# Patient Record
Sex: Female | Born: 1980 | Race: White | Hispanic: No | Marital: Married | Smoking: Current every day smoker
Health system: Southern US, Community
[De-identification: ages and names within clinical notes are randomized; demographics above are authoritative.]

## PROBLEM LIST (undated history)

## (undated) DIAGNOSIS — E119 Type 2 diabetes mellitus without complications: Secondary | ICD-10-CM

## (undated) DIAGNOSIS — K859 Acute pancreatitis without necrosis or infection, unspecified: Secondary | ICD-10-CM

## (undated) HISTORY — PX: BILE DUCT STENT PLACEMENT: SHX1227

## (undated) HISTORY — PX: OTHER SURGICAL HISTORY: SHX169

## (undated) HISTORY — PX: KNEE SURGERY: SHX244

## (undated) HISTORY — PX: CHOLECYSTECTOMY: SHX55

## (undated) HISTORY — PX: SYMPATHECTOMY: SHX792

---

## 2015-06-22 ENCOUNTER — Emergency Department: Admit: 2015-06-22 | Payer: Self-pay | Primary: Student in an Organized Health Care Education/Training Program

## 2015-06-22 ENCOUNTER — Inpatient Hospital Stay
Admit: 2015-06-22 | Discharge: 2015-06-23 | Disposition: A | Discharge status: Home / Self Care | Payer: Self-pay | Attending: Emergency Medicine

## 2015-06-22 DIAGNOSIS — F10120 Alcohol abuse with intoxication, uncomplicated: Secondary | ICD-10-CM

## 2015-06-22 MED ORDER — SODIUM CHLORIDE 0.9 % IJ SYRG
INTRAMUSCULAR | Status: DC | PRN
Start: 2015-06-22 — End: 2015-06-23

## 2015-06-22 MED ORDER — SODIUM CHLORIDE 0.9 % IJ SYRG
Freq: Three times a day (TID) | INTRAMUSCULAR | Status: DC
Start: 2015-06-22 — End: 2015-06-23

## 2015-06-22 NOTE — ED Notes (Signed)
Pt refusing all vital signs, IV and to put gown on. Dr. Leonel Ramsay notified.
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
## 2015-06-22 NOTE — ED Notes (Signed)
Pt has no complaints at this time. She is resting in room.

## 2015-06-22 NOTE — ED Notes (Signed)
Pt to CT scan.
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
## 2015-06-23 LAB — METABOLIC PANEL, COMPREHENSIVE
A-G Ratio: 0.6 — ABNORMAL LOW (ref 1.2–3.5)
ALT (SGPT): 48 U/L (ref 12–65)
AST (SGOT): 367 U/L — ABNORMAL HIGH (ref 15–37)
Albumin: 3 g/dL — ABNORMAL LOW (ref 3.5–5.0)
Alk. phosphatase: 452 U/L — ABNORMAL HIGH (ref 50–136)
Anion gap: 11 mmol/L (ref 7–16)
BUN: 3 MG/DL — ABNORMAL LOW (ref 6–23)
Bilirubin, total: 1 MG/DL (ref 0.2–1.1)
CO2: 32 mmol/L (ref 21–32)
Calcium: 8 MG/DL — ABNORMAL LOW (ref 8.3–10.4)
Chloride: 102 mmol/L (ref 98–107)
Creatinine: 0.55 MG/DL — ABNORMAL LOW (ref 0.6–1.0)
GFR est AA: >60 mL/min/{1.73_m2} (ref >60)
GFR est non-AA: >60 mL/min/{1.73_m2} (ref >60)
Globulin: 5.3 g/dL — ABNORMAL HIGH (ref 2.3–3.5)
Glucose: 106 mg/dL — ABNORMAL HIGH (ref 65–100)
Potassium: 2.7 mmol/L — CL (ref 3.5–5.1)
Protein, total: 8.3 g/dL — ABNORMAL HIGH (ref 6.3–8.2)
Sodium: 145 mmol/L (ref 136–145)

## 2015-06-23 LAB — CBC W/O DIFF
HCT: 35.9 % (ref 35.8–46.3)
HGB: 11.3 g/dL — ABNORMAL LOW (ref 11.7–15.4)
MCH: 28.6 PG (ref 26.1–32.9)
MCHC: 31.5 g/dL (ref 31.4–35.0)
MCV: 90.9 FL (ref 79.6–97.8)
MPV: 10.3 FL — ABNORMAL LOW (ref 10.8–14.1)
PLATELET: 270 10*3/uL (ref 150–450)
RBC: 3.95 M/uL — ABNORMAL LOW (ref 4.05–5.25)
RDW: 17.3 % — ABNORMAL HIGH (ref 11.9–14.6)
WBC: 7.6 10*3/uL (ref 4.3–11.1)

## 2015-06-23 LAB — ETHYL ALCOHOL: ALCOHOL(ETHYL),SERUM: 433 MG/DL — CR

## 2015-06-23 LAB — MAGNESIUM: Magnesium: 1.9 mg/dL (ref 1.8–2.4)

## 2015-06-23 MED ORDER — POTASSIUM CHLORIDE SR 20 MEQ TAB, PARTICLES/CRYSTALS
20 mEq | ORAL | Status: AC
Start: 2015-06-23 — End: 2015-06-22
  Administered 2015-06-23: 02:00:00 via ORAL

## 2015-06-23 MED FILL — KLOR-CON M20 MEQ TABLET,EXTENDED RELEASE: 20 mEq | ORAL | Qty: 2

## 2015-06-23 NOTE — ED Notes (Signed)
Pt respositioned self in bed, no complaints voiced, settled back to sleep.

## 2015-06-23 NOTE — ED Notes (Signed)
Pt given ginger ale, number for [facility],walked to lobby.

## 2015-06-23 NOTE — ED Notes (Signed)
Pt sitting in the sink having a bowel movement, stool all over the floor.Asked pt why she didn't go to the bathroom.Pt stated "she didn't want to go to the bathroom"

## 2015-06-23 NOTE — ED Notes (Signed)
Pt awaken easily, call bell connected to siderail showed pt, she states no complaints , settled back to sleep.

## 2015-06-23 NOTE — ED Notes (Signed)
I have reviewed discharge instructions with the patient.  The patient verbalized understanding.Pt assisted to lobby with additional intructions.

## 2015-06-23 NOTE — ED Notes (Signed)
Pt given scrubs, wipes and instructed to clean up.

## 2015-06-23 NOTE — ED Notes (Signed)
Pt being allowed to sleep a while longer. She states that she does not want to be connected to Vital sign monitors.

## 2017-11-19 ENCOUNTER — Encounter (HOSPITAL_COMMUNITY): Payer: Self-pay

## 2017-11-19 ENCOUNTER — Emergency Department (HOSPITAL_COMMUNITY)
Admission: EM | Admit: 2017-11-19 | Discharge: 2017-11-20 | Disposition: A | Discharge status: Home / Self Care | Payer: BLUE CROSS/BLUE SHIELD | Attending: Emergency Medicine | Admitting: Emergency Medicine

## 2017-11-19 DIAGNOSIS — E119 Type 2 diabetes mellitus without complications: Secondary | ICD-10-CM | POA: Insufficient documentation

## 2017-11-19 DIAGNOSIS — G8929 Other chronic pain: Secondary | ICD-10-CM

## 2017-11-19 DIAGNOSIS — F17200 Nicotine dependence, unspecified, uncomplicated: Secondary | ICD-10-CM | POA: Diagnosis not present

## 2017-11-19 DIAGNOSIS — R109 Unspecified abdominal pain: Secondary | ICD-10-CM | POA: Insufficient documentation

## 2017-11-19 DIAGNOSIS — Z794 Long term (current) use of insulin: Secondary | ICD-10-CM | POA: Insufficient documentation

## 2017-11-19 DIAGNOSIS — Z79899 Other long term (current) drug therapy: Secondary | ICD-10-CM | POA: Diagnosis not present

## 2017-11-19 HISTORY — DX: Type 2 diabetes mellitus without complications: E11.9

## 2017-11-19 HISTORY — DX: Acute pancreatitis without necrosis or infection, unspecified: K85.90

## 2017-11-19 LAB — CBC
HCT: 41.8 % (ref 36.0–46.0)
Hemoglobin: 13.4 g/dL (ref 12.0–15.0)
MCH: 28.8 pg (ref 26.0–34.0)
MCHC: 32.1 g/dL (ref 30.0–36.0)
MCV: 89.9 fL (ref 78.0–100.0)
Platelets: 190 10*3/uL (ref 150–400)
RBC: 4.65 MIL/uL (ref 3.87–5.11)
RDW: 14.9 % (ref 11.5–15.5)
WBC: 8.7 10*3/uL (ref 4.0–10.5)

## 2017-11-19 LAB — URINALYSIS, ROUTINE W REFLEX MICROSCOPIC
Bilirubin Urine: NEGATIVE
HGB URINE DIPSTICK: NEGATIVE
Ketones, ur: NEGATIVE mg/dL
Leukocytes, UA: NEGATIVE
Nitrite: NEGATIVE
PROTEIN: NEGATIVE mg/dL
SPECIFIC GRAVITY, URINE: 1.032 — AB (ref 1.005–1.030)
pH: 8 (ref 5.0–8.0)

## 2017-11-19 LAB — COMPREHENSIVE METABOLIC PANEL
ALT: 20 U/L (ref 0–44)
AST: 25 U/L (ref 15–41)
Albumin: 3.4 g/dL — ABNORMAL LOW (ref 3.5–5.0)
Alkaline Phosphatase: 150 U/L — ABNORMAL HIGH (ref 38–126)
Anion gap: 13 (ref 5–15)
CALCIUM: 8.9 mg/dL (ref 8.9–10.3)
CHLORIDE: 94 mmol/L — AB (ref 98–111)
CO2: 25 mmol/L (ref 22–32)
CREATININE: 0.73 mg/dL (ref 0.44–1.00)
GFR calc Af Amer: >60 mL/min (ref >60)
GFR calc non Af Amer: >60 mL/min (ref >60)
GLUCOSE: 699 mg/dL — AB (ref 70–99)
Potassium: 4.2 mmol/L (ref 3.5–5.1)
Sodium: 132 mmol/L — ABNORMAL LOW (ref 135–145)
Total Bilirubin: 0.6 mg/dL (ref 0.3–1.2)
Total Protein: 6.7 g/dL (ref 6.5–8.1)

## 2017-11-19 LAB — I-STAT BETA HCG BLOOD, ED (MC, WL, AP ONLY): I-stat hCG, quantitative: <5 m[IU]/mL (ref <5)

## 2017-11-19 LAB — LIPASE, BLOOD: Lipase: 178 U/L — ABNORMAL HIGH (ref 11–51)

## 2017-11-19 NOTE — ED Notes (Signed)
Lab called with glucose result of 699

## 2017-11-19 NOTE — ED Triage Notes (Signed)
Pt states that she is here for pancreatitis pain, started last night, denies alcohol use, vomited x 1.

## 2017-11-20 MED ORDER — HYDROMORPHONE HCL 1 MG/ML IJ SOLN
1.0000 mg | Freq: Once | INTRAMUSCULAR | Status: AC
  Administered 2017-11-20: 1 mg via INTRAVENOUS
  Filled 2017-11-20: qty 1

## 2017-11-20 MED ORDER — FAMOTIDINE IN NACL 20-0.9 MG/50ML-% IV SOLN
20.0000 mg | INTRAVENOUS | Status: AC
  Administered 2017-11-20: 20 mg via INTRAVENOUS
  Filled 2017-11-20: qty 50

## 2017-11-20 MED ORDER — INSULIN ASPART 100 UNIT/ML ~~LOC~~ SOLN
10.0000 [IU] | Freq: Once | SUBCUTANEOUS | Status: AC
  Administered 2017-11-20: 10 [IU] via INTRAVENOUS
  Filled 2017-11-20: qty 1

## 2017-11-20 MED ORDER — KETOROLAC TROMETHAMINE 30 MG/ML IJ SOLN
30.0000 mg | Freq: Once | INTRAMUSCULAR | Status: AC
  Administered 2017-11-20: 30 mg via INTRAVENOUS
  Filled 2017-11-20: qty 1

## 2017-11-20 MED ORDER — SODIUM CHLORIDE 0.9 % IV BOLUS
1000.0000 mL | Freq: Once | INTRAVENOUS | Status: AC
  Administered 2017-11-20: 1000 mL via INTRAVENOUS

## 2017-11-20 NOTE — ED Provider Notes (Signed)
[facility] EMERGENCY DEPARTMENT Provider Note   CSN: [id number] Arrival date & time: 11/19/17  2130     History   Chief Complaint Chief Complaint  Patient presents with  . Abdominal Pain    HPI Heather Hunt is a 37 y.o. female.  37 year old female with PMH of recurrent acute pancreatitis (?idiopathic vs. 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 Diff on vanc presents to the ED for c/o epigastric pain. Pain is sharp, constant, c/w prior episodes of pancreatitis. Has had nausea and vomiting x 1. Patient visiting from out of town. Has use her home Fentanyl patches and oxycodone tablets for pain without relief. No fevers, bowel changes, urinary symptoms. Abdominal surgical hx significant for cholecystectomy.     Past Medical History:  Diagnosis Date  . Diabetes mellitus without complication (HCC)   . Pancreatitis     There are no active problems to display for this patient.   Past Surgical History:  Procedure Laterality Date  . BILE DUCT STENT PLACEMENT    . KNEE SURGERY    . pancreas stent       OB History   None      Home Medications    Prior to Admission medications   Medication Sig Start Date End Date Taking? Authorizing Provider  fentaNYL (DURAGESIC - DOSED MCG/HR) 12 MCG/HR Place 12.5 mcg onto the skin every 3 (three) days.    Yes [provider]  fentaNYL (DURAGESIC - DOSED MCG/HR) 25 MCG/HR patch Place 25 mcg onto the skin every 3 (three) days.    Yes [provider]  insulin degludec (TRESIBA FLEXTOUCH) 100 UNIT/ML SOPN FlexTouch Pen Inject 26 Units into the skin daily. 08/03/17  Yes [provider]  insulin regular (HUMULIN R) 100 units/mL injection Inject 1-10 Units into the skin 3 (three) times daily with meals. Sliding scale 03/04/17   Yes [provider]  lipase/protease/amylase (CREON) 12000 units CPEP capsule Take 2 capsules by mouth 3 (three) times daily.   Yes [provider]  ondansetron (ZOFRAN-ODT) 4 MG disintegrating tablet Take 4 mg by mouth every 6 (six) hours as needed for nausea or vomiting.  11/11/17  Yes [provider]  Oxycodone HCl 10 MG TABS Take 10 mg by mouth every 4 (four) hours as needed (for pain).  08/03/17  Yes [provider]  pantoprazole (PROTONIX) 40 MG tablet Take 40 mg by mouth daily. 10/12/17  Yes [provider]  promethazine (PHENERGAN) 12.5 MG tablet Take 12.5 mg by mouth every 6 (six) hours as needed for nausea or vomiting.  11/11/17  Yes [provider]    Family History No family history on file.  Social History Social History   Tobacco Use  . Smoking status: Current Every Day Smoker  . Smokeless tobacco: Never Used  Substance Use Topics  . Alcohol use: Not Currently  . Drug use: Never     Allergies   Morphine and related   Review of Systems Review of Systems Ten systems reviewed and are negative for acute change, except as noted in the HPI.    Physical Exam Updated Vital Signs BP 90/60   Pulse 77   Temp 98.3 F (36.8 C) (Oral)   Resp 14   SpO2 97%   Physical Exam  Constitutional: She is oriented to person, place, and time.  She appears well-developed and well-nourished. No distress.  Nontoxic appearing and in NAD  HENT:  Head: Normocephalic and atraumatic.  Eyes: Conjunctivae and EOM are normal. No scleral icterus.  Neck: Normal range of motion.  Cardiovascular: Normal rate, regular rhythm and intact distal pulses.  Pulmonary/Chest: Effort normal. No stridor. No respiratory distress. She has no wheezes. She has no rales.  Lungs CTAB. Respirations even and unlabored.  Abdominal: Soft. She exhibits no mass. There is tenderness. There is no guarding.  Epigastric TTP. Abdomen soft, nondistended.  Musculoskeletal:  Normal range of motion.  Neurological: She is alert and oriented to person, place, and time. She exhibits normal muscle tone. Coordination normal.  Skin: Skin is warm and dry. No rash noted. She is not diaphoretic. No erythema. No pallor.  Psychiatric: She has a normal mood and affect. Her behavior is normal.  Nursing note and vitals reviewed.    ED Treatments / Results  Labs (all labs ordered are listed, but only abnormal results are displayed) Labs Reviewed  LIPASE, BLOOD - Abnormal; Notable for the following components:      Result Value   Lipase 178 (*)    All other components within normal limits  COMPREHENSIVE METABOLIC PANEL - Abnormal; Notable for the following components:   Sodium 132 (*)    Chloride 94 (*)    Glucose, Bld 699 (*)    BUN <5 (*)    Albumin 3.4 (*)    Alkaline Phosphatase 150 (*)    All other components within normal limits  URINALYSIS, ROUTINE W REFLEX MICROSCOPIC - Abnormal; Notable for the following components:   Color, Urine STRAW (*)    Specific Gravity, Urine 1.032 (*)    Glucose, UA >=500 (*)    Bacteria, UA RARE (*)    All other components within normal limits  CBC  I-STAT BETA HCG BLOOD, ED ([facility], [facility], [facility] ONLY)    EKG None  Radiology No results found.  Procedures Procedures (including critical care time)  Medications Ordered in ED Medications  sodium chloride 0.9 % bolus 1,000 mL (0 mLs Intravenous Stopped 11/20/17 0249)  insulin aspart (novoLOG) injection 10 Units (10 Units Intravenous Given 11/20/17 0246)  famotidine (PEPCID) IVPB 20 mg premix (0 mg Intravenous Stopped 11/20/17 0321)  ketorolac (TORADOL) 30 MG/ML injection 30 mg (30 mg Intravenous Given 11/20/17 0246)  HYDROmorphone (DILAUDID) injection 1 mg (1 mg Intravenous Given 11/20/17 0246)  HYDROmorphone (DILAUDID) injection 1 mg (1 mg Intravenous Given 11/20/17 0429)     Initial Impression / Assessment and Plan / ED Course  I have reviewed the triage vital signs and the nursing  notes.  Pertinent labs & imaging results that were available during my care of the patient were reviewed by me and considered in my medical decision making (see chart for details).  Clinical Course as of Nov 24 642  Sat Nov 20, 2017  0205 Lipase(!): 178 [KH]    Clinical Course User Index [KH] Antony Madura, [place]    37 year old female presents to the emergency department for evaluation of abdominal pain.  She has known history of chronic pancreatitis and states that her pain today feels similar to prior exacerbations.  She has not taken her prescribed insulin accounting for her hyperglycemia today.  She has a normal anion gap and no ketonuria.  No concern for DKA at this time.  She was given 10 units of NovoLog for management of hyperglycemia.  Patient also administered 1 L IV fluids.  Lipase elevated at  178.  With prior admissions for acute pancreatitis, lipase has been at least 300.  She does have focal epigastric tenderness.  No peritoneal signs on exam.  Pain has been managed with Toradol, Pepcid, 1mg  IV Dilaudid x2.  The patient has had improvement in her abdominal pain with these medications.  Her repeat exam is stable.  She is now tolerating oral fluids.  Patient states that she feels as though her pain has improved.  Have stressed the need for compliance with outpatient medications.  Return precautions discussed and provided. Patient discharged in stable condition with no unaddressed concerns.   Final Clinical Impressions(s) / ED Diagnoses   Final diagnoses:  Chronic abdominal pain    ED Discharge Orders    None       Antony MaduraHumes, Miche Loughridge, PA-C 11/23/17 [id number]    Dione BoozeGlick, David, MD 11/25/17 1416

## 2017-12-07 ENCOUNTER — Emergency Department (HOSPITAL_COMMUNITY)
Admission: EM | Admit: 2017-12-07 | Discharge: 2017-12-08 | Disposition: A | Discharge status: Home / Self Care | Payer: BLUE CROSS/BLUE SHIELD | Attending: Emergency Medicine | Admitting: Emergency Medicine

## 2017-12-07 ENCOUNTER — Other Ambulatory Visit: Payer: Self-pay

## 2017-12-07 ENCOUNTER — Encounter (HOSPITAL_COMMUNITY): Payer: Self-pay

## 2017-12-07 DIAGNOSIS — Z794 Long term (current) use of insulin: Secondary | ICD-10-CM | POA: Insufficient documentation

## 2017-12-07 DIAGNOSIS — K861 Other chronic pancreatitis: Secondary | ICD-10-CM | POA: Diagnosis not present

## 2017-12-07 DIAGNOSIS — E1165 Type 2 diabetes mellitus with hyperglycemia: Secondary | ICD-10-CM | POA: Diagnosis not present

## 2017-12-07 DIAGNOSIS — Z79899 Other long term (current) drug therapy: Secondary | ICD-10-CM | POA: Diagnosis not present

## 2017-12-07 DIAGNOSIS — F17210 Nicotine dependence, cigarettes, uncomplicated: Secondary | ICD-10-CM | POA: Insufficient documentation

## 2017-12-07 DIAGNOSIS — R1013 Epigastric pain: Secondary | ICD-10-CM | POA: Diagnosis present

## 2017-12-07 DIAGNOSIS — R110 Nausea: Secondary | ICD-10-CM | POA: Diagnosis not present

## 2017-12-07 LAB — URINALYSIS, ROUTINE W REFLEX MICROSCOPIC
BILIRUBIN URINE: NEGATIVE
Bacteria, UA: NONE SEEN
HGB URINE DIPSTICK: NEGATIVE
Ketones, ur: NEGATIVE mg/dL
Leukocytes, UA: NEGATIVE
NITRITE: NEGATIVE
Protein, ur: NEGATIVE mg/dL
SPECIFIC GRAVITY, URINE: 1.027 (ref 1.005–1.030)
pH: 7 (ref 5.0–8.0)

## 2017-12-07 LAB — CBC
HCT: 40.3 % (ref 36.0–46.0)
HEMOGLOBIN: 13.5 g/dL (ref 12.0–15.0)
MCH: 29.3 pg (ref 26.0–34.0)
MCHC: 33.5 g/dL (ref 30.0–36.0)
MCV: 87.6 fL (ref 78.0–100.0)
PLATELETS: 165 10*3/uL (ref 150–400)
RBC: 4.6 MIL/uL (ref 3.87–5.11)
RDW: 15.8 % — ABNORMAL HIGH (ref 11.5–15.5)
WBC: 6.9 10*3/uL (ref 4.0–10.5)

## 2017-12-07 LAB — COMPREHENSIVE METABOLIC PANEL
ALK PHOS: 350 U/L — AB (ref 38–126)
ALT: 67 U/L — AB (ref 0–44)
ANION GAP: 10 (ref 5–15)
AST: 67 U/L — ABNORMAL HIGH (ref 15–41)
Albumin: 3.7 g/dL (ref 3.5–5.0)
BILIRUBIN TOTAL: 0.3 mg/dL (ref 0.3–1.2)
BUN: 6 mg/dL (ref 6–20)
CALCIUM: 9.2 mg/dL (ref 8.9–10.3)
CO2: 26 mmol/L (ref 22–32)
CREATININE: 0.48 mg/dL (ref 0.44–1.00)
Chloride: 99 mmol/L (ref 98–111)
GFR calc non Af Amer: >60 mL/min (ref >60)
GLUCOSE: 454 mg/dL — AB (ref 70–99)
Potassium: 3.9 mmol/L (ref 3.5–5.1)
Sodium: 135 mmol/L (ref 135–145)
TOTAL PROTEIN: 7.1 g/dL (ref 6.5–8.1)

## 2017-12-07 LAB — LIPASE, BLOOD: Lipase: 174 U/L — ABNORMAL HIGH (ref 11–51)

## 2017-12-07 LAB — I-STAT BETA HCG BLOOD, ED (MC, WL, AP ONLY)

## 2017-12-07 LAB — CBG MONITORING, ED: Glucose-Capillary: 202 mg/dL — ABNORMAL HIGH (ref 70–99)

## 2017-12-07 MED ORDER — GI COCKTAIL ~~LOC~~
30.0000 mL | Freq: Once | ORAL | Status: AC
  Administered 2017-12-07: 30 mL via ORAL
  Filled 2017-12-07: qty 30

## 2017-12-07 MED ORDER — SODIUM CHLORIDE 0.9 % IV BOLUS
1000.0000 mL | Freq: Once | INTRAVENOUS | Status: AC
  Administered 2017-12-07: 1000 mL via INTRAVENOUS

## 2017-12-07 MED ORDER — BLOOD GLUCOSE MONITOR KIT: PACK | 0 refills | Status: AC

## 2017-12-07 MED ORDER — HYDROMORPHONE HCL 1 MG/ML IJ SOLN
1.0000 mg | Freq: Once | INTRAMUSCULAR | Status: AC
  Administered 2017-12-07: 1 mg via INTRAVENOUS
  Filled 2017-12-07: qty 1

## 2017-12-07 MED ORDER — FAMOTIDINE IN NACL 20-0.9 MG/50ML-% IV SOLN
20.0000 mg | Freq: Once | INTRAVENOUS | Status: AC
  Administered 2017-12-07: 20 mg via INTRAVENOUS
  Filled 2017-12-07: qty 50

## 2017-12-07 MED ORDER — ONDANSETRON HCL 4 MG/2ML IJ SOLN
4.0000 mg | Freq: Once | INTRAMUSCULAR | Status: AC
  Administered 2017-12-07: 4 mg via INTRAVENOUS
  Filled 2017-12-07: qty 2

## 2017-12-07 MED ORDER — ONDANSETRON 4 MG PO TBDP: 4.0000 mg | ORAL_TABLET | Freq: Once | ORAL | Status: DC | PRN

## 2017-12-07 NOTE — Discharge Instructions (Addendum)
Please read and follow all provided instructions.  Your diagnoses today include:  1. Chronic pancreatitis, unspecified pancreatitis type (HCC)   2. Epigastric pain   3. Nausea   4. Type 2 diabetes mellitus with hyperglycemia, with long-term current use of insulin (HCC)    Work up here showed high blood glucose. Lipase is around your baseline. Please follow up with GI and with primary care.   Tests performed today include: Blood counts and electrolytes Blood tests to check liver and kidney function Blood tests to check pancreas function Urine test to look for infection and pregnancy (in women) Vital signs. See below for your results today.   Medications prescribed:   Take any prescribed medications only as directed.  Home care instructions:  Follow any educational materials contained in this packet.  Follow-up instructions: Please follow-up with your primary care provider in the next 2 days for further evaluation of your symptoms.    Return instructions:  SEEK IMMEDIATE MEDICAL ATTENTION IF: The pain does not go away or becomes severe  A temperature above 101F develops  Repeated vomiting occurs (multiple episodes)  The pain becomes localized to portions of the abdomen. The right side could possibly be appendicitis. In an adult, the left lower portion of the abdomen could be colitis or diverticulitis.  Blood is being passed in stools or vomit (bright red or black tarry stools)  You develop chest pain, difficulty breathing, dizziness or fainting, or become confused, poorly responsive, or inconsolable (young children) If you have any other emergent concerns regarding your health  Additional Information: Abdominal (belly) pain can be caused by many things. Your caregiver performed an examination and possibly ordered blood/urine tests and imaging (CT scan, x-rays, ultrasound). Many cases can be observed and treated at home after initial evaluation in the emergency department. Even  though you are being discharged home, abdominal pain can be unpredictable. Therefore, you need a repeated exam if your pain does not resolve, returns, or worsens. Most patients with abdominal pain don't have to be admitted to the hospital or have surgery, but serious problems like appendicitis and gallbladder attacks can start out as nonspecific pain. Many abdominal conditions cannot be diagnosed in one visit, so follow-up evaluations are very important.  Your vital signs today were: BP 127/89 (BP Location: Left Arm)    Pulse 71    Temp 98.1 F (36.7 C) (Oral)    Resp 16    Ht 5\' 6"  (1.676 m)    Wt 51.7 kg    SpO2 100%    BMI 18.40 kg/m  If your blood pressure (bp) was elevated above 135/85 this visit, please have this repeated by your doctor within one month. --------------

## 2017-12-07 NOTE — ED Notes (Signed)
I attempted to collect labs and was unsuccessful.  Patient has a lot of scar tissue

## 2017-12-07 NOTE — ED Triage Notes (Addendum)
Pt c/o generalized abd pain + nausea starting last night. No vomiting. Hx pancreatitis. Pt unsure of trigger for flare up. Denies chest pain or shortness of breath. Taken zofran with no relief.

## 2017-12-07 NOTE — ED Notes (Signed)
Patient left without completing the discharge process.

## 2017-12-07 NOTE — ED Provider Notes (Signed)
[facility] DEPT Provider Note   CSN: [id number] Arrival date & time: 12/07/17  1930     History   Chief Complaint Chief Complaint  Patient presents with  . Abdominal Pain    HPI Heather Hunt is a 37 y.o. female.  Heather Hunt is a 37 y.o. Female with a history of chronic abdominal pain, and recurrent pancreatitis who presents to the emergency department complaining of abdominal pain today.  Patient reports she feels that her pancreatitis is flared up.  She combines of epigastric abdominal pain with nausea and some diarrhea.  No vomiting.  She reports she is used her home fentanyl patches as well as oxycodone without any relief.  Pancreatitis is thought to be due to alcohol or idiopathic.  She reports she is not drinking alcohol.  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  He is not currently followed by GI.  Previous abdominal surgical history includes a cholecystectomy and common bile duct stenting.  She denies fevers, vomiting, hematochezia, recent antibiotic use, lower abdominal pain, urinary symptoms, vaginal bleeding, vaginal discharge or rashes.  The history is provided by the patient and medical records. No language interpreter was used.  Abdominal Pain   Associated symptoms include diarrhea and nausea. Pertinent negatives include fever, vomiting, dysuria and headaches.    Past Medical History:  Diagnosis Date  . Diabetes mellitus without complication ([place])   . Pancreatitis     There are no active problems to display for this patient.   Past Surgical History:  Procedure Laterality Date  . BILE DUCT STENT PLACEMENT    . KNEE SURGERY    . pancreas stent       OB History   None      Home Medications    Prior to Admission medications   Medication Sig Start Date End Date Taking? Authorizing Provider  escitalopram (LEXAPRO) 10 MG tablet Take 10 mg by mouth  daily. m   Yes [provider]  fentaNYL (DURAGESIC - DOSED MCG/HR) 12 MCG/HR Place 12.5 mcg onto the skin every 3 (three) days.    Yes [provider]  fentaNYL (DURAGESIC - DOSED MCG/HR) 25 MCG/HR patch Place 25 mcg onto the skin every 3 (three) days.    Yes [provider]  insulin degludec (TRESIBA FLEXTOUCH) 100 UNIT/ML SOPN FlexTouch Pen Inject 26 Units into the skin daily. 08/03/17  Yes [provider]  insulin regular (HUMULIN R) 100 units/mL injection Inject 1-10 Units into the skin 3 (three) times daily before meals. Sliding scale  03/04/17  Yes [provider]  lipase/protease/amylase (CREON) 12000 units CPEP capsule Take 2 capsules by mouth 3 (three) times daily.   Yes [provider]  ondansetron (ZOFRAN-ODT) 4 MG disintegrating tablet Take 4 mg by mouth every 6 (six) hours as needed for nausea or vomiting.  11/11/17  Yes [provider]  pantoprazole (PROTONIX) 40 MG tablet Take 40 mg by mouth daily. 10/12/17  Yes [provider]  pregabalin (LYRICA) 50 MG capsule Take 50 mg by mouth 3 (three) times daily.   Yes [provider]  promethazine (PHENERGAN) 12.5 MG tablet Take 12.5 mg by mouth every 6 (six) hours as needed for nausea or vomiting.  11/11/17  Yes [provider]  blood glucose meter kit and supplies KIT Dispense based on patient and insurance preference. Use up to four times daily as directed. (FOR ICD-9 250.00,  250.01). 12/07/17   Waynetta Pean, PA-C    Family History History reviewed. No pertinent family history.  Social History Social History   Tobacco Use  . Smoking status: Current Every Day Smoker  . Smokeless tobacco: Never Used  Substance Use Topics  . Alcohol use: Not Currently  . Drug use: Never     Allergies   Morphine and related   Review of Systems Review of Systems  Constitutional: Negative for chills and fever.  HENT: Negative for congestion and sore throat.     Eyes: Negative for visual disturbance.  Respiratory: Negative for cough, shortness of breath and wheezing.   Cardiovascular: Negative for chest pain and palpitations.  Gastrointestinal: Positive for abdominal pain, diarrhea and nausea. Negative for blood in stool and vomiting.  Genitourinary: Negative for difficulty urinating, dysuria, pelvic pain, vaginal bleeding and vaginal discharge.  Musculoskeletal: Negative for back pain and neck pain.  Skin: Negative for rash.  Neurological: Negative for syncope and headaches.     Physical Exam Updated Vital Signs BP 127/89 (BP Location: Left Arm)   Pulse 71   Temp 98.1 F (36.7 C) (Oral)   Resp 16   Ht '5\' 6"'$  (1.676 m)   Wt 51.7 kg   SpO2 100%   BMI 18.40 kg/m   Physical Exam  Constitutional: She appears well-developed and well-nourished.  Non-toxic appearance. She does not appear ill. No distress.  Nontoxic-appearing.  HENT:  Head: Normocephalic and atraumatic.  Mouth/Throat: Oropharynx is clear and moist.  Eyes: Pupils are equal, round, and reactive to light. Conjunctivae are normal. Right eye exhibits no discharge. Left eye exhibits no discharge.  Neck: Neck supple.  Cardiovascular: Normal rate, regular rhythm, normal heart sounds and intact distal pulses. Exam reveals no gallop and no friction rub.  No murmur heard. Pulmonary/Chest: Effort normal and breath sounds normal. No respiratory distress. She has no wheezes. She has no rales.  Abdominal: Soft. Bowel sounds are normal. She exhibits no distension and no ascites. There is generalized tenderness and tenderness in the epigastric area. There is no CVA tenderness.  Abdomen is soft.  Bowel sounds are present.  Patient has generalized abdominal tenderness to palpation that is worse in her epigastric region.  No peritoneal signs.  No CVA or flank tenderness.  Musculoskeletal: She exhibits no edema.  Lymphadenopathy:    She has no cervical adenopathy.  Neurological: She is alert.  Coordination normal.  Skin: Skin is warm and dry. No rash noted. She is not diaphoretic. No erythema. No pallor.  Psychiatric: She has a normal mood and affect. Her behavior is normal.  Nursing note and vitals reviewed.    ED Treatments / Results  Labs (all labs ordered are listed, but only abnormal results are displayed) Labs Reviewed  LIPASE, BLOOD - Abnormal; Notable for the following components:      Result Value   Lipase 174 (*)    All other components within normal limits  COMPREHENSIVE METABOLIC PANEL - Abnormal; Notable for the following components:   Glucose, Bld 454 (*)    AST 67 (*)    ALT 67 (*)    Alkaline Phosphatase 350 (*)    All other components within normal limits  CBC - Abnormal; Notable for the following components:   RDW 15.8 (*)    All other components within normal limits  URINALYSIS, ROUTINE W REFLEX MICROSCOPIC - Abnormal; Notable for the following components:   Color, Urine STRAW (*)    Glucose, UA >=500 (*)  All other components within normal limits  CBG MONITORING, ED - Abnormal; Notable for the following components:   Glucose-Capillary 202 (*)    All other components within normal limits  I-STAT BETA HCG BLOOD, ED ([facility], [facility], [facility] ONLY)    EKG None  Radiology No results found.  Procedures Procedures (including critical care time)  Medications Ordered in ED Medications  sodium chloride 0.9 % bolus 1,000 mL (0 mLs Intravenous Stopped 12/07/17 2255)  ondansetron (ZOFRAN) injection 4 mg (4 mg Intravenous Given 12/07/17 2150)  HYDROmorphone (DILAUDID) injection 1 mg (1 mg Intravenous Given 12/07/17 2150)  famotidine (PEPCID) IVPB 20 mg premix (0 mg Intravenous Stopped 12/07/17 2232)  gi cocktail (Maalox,Lidocaine,Donnatal) (30 mLs Oral Given 12/07/17 2258)     Initial Impression / Assessment and Plan / ED Course  I have reviewed the triage vital signs and the nursing notes.  Pertinent labs & imaging results that were available during my care of  the patient were reviewed by me and considered in my medical decision making (see chart for details).     This is a 37 y.o. Female with a history of chronic abdominal pain, and recurrent pancreatitis who presents to the emergency department complaining of abdominal pain today.  Patient reports she feels that her pancreatitis is flared up.  She combines of epigastric abdominal pain with nausea and some diarrhea.  No vomiting.  She reports she is used her home fentanyl patches as well as oxycodone without any relief.  Pancreatitis is thought to be due to alcohol or idiopathic.  She reports she is not drinking alcohol.  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  He is not currently followed by GI.  Previous abdominal surgical history includes a cholecystectomy and common bile duct stenting.  She denies fevers, vomiting, hematochezia, recent antibiotic use, lower abdominal pain, urinary symptoms, vaginal bleeding, vaginal discharge or rashes. On exam the patient is afebrile and nontoxic-appearing.  Her abdomen is soft and she has mild generalized abdominal tenderness to palpation that is worse in her epigastric region.  No CVA or flank tenderness.  No peritoneal signs. CBC shows no leukocytosis.  CMP is remarkable for a glucose of 454 with a normal anion gap.  Mildly elevated liver enzymes with an AST of 67 and ALT of 67.  Patient has had mild elevations in the past.  Lipase is 174.  This is similar to what was the last time she was in the emergency department.  Pregnancy test is negative.  Urinalysis without sign of infection.  At reevaluation following pain medicine, nausea medicine and fluids patient reports she is feeling much better.  She is able to tolerate p.o.  Repeat blood glucose is 202.  She feels much better and ready for discharge.  Will discharge with follow-up with gastroenterology.  Suspect chronic pancreatitis.  I  encouraged her to watch her blood sugars closely.  She tells me she has plenty of insulin at home.  She also has a glucometer.  I provided her with information for follow-up with GI and primary care. I advised the patient to follow-up with their primary care provider this week. I advised the patient to return to the emergency department with new or worsening symptoms or new concerns. The patient verbalized understanding and agreement with plan.     Final Clinical Impressions(s) / ED Diagnoses   Final diagnoses:  Chronic pancreatitis, unspecified pancreatitis type ([place])  Epigastric  pain  Nausea  Type 2 diabetes mellitus with hyperglycemia, with long-term current use of insulin [facility])    ED Discharge Orders         Ordered    blood glucose meter kit and supplies KIT     12/07/17 2319           Waynetta Pean, PA-C 12/07/17 2333    Carmin Muskrat, MD 12/07/17 2356

## 2017-12-08 NOTE — ED Notes (Signed)
Patient walked out before nurse was able to remove her IV.

## 2017-12-25 ENCOUNTER — Other Ambulatory Visit: Payer: Self-pay

## 2017-12-25 ENCOUNTER — Encounter (HOSPITAL_COMMUNITY): Payer: Self-pay

## 2017-12-25 ENCOUNTER — Emergency Department (HOSPITAL_COMMUNITY)
Admission: EM | Admit: 2017-12-25 | Discharge: 2017-12-25 | Disposition: A | Discharge status: Home / Self Care | Payer: BLUE CROSS/BLUE SHIELD | Attending: Emergency Medicine | Admitting: Emergency Medicine

## 2017-12-25 DIAGNOSIS — E119 Type 2 diabetes mellitus without complications: Secondary | ICD-10-CM | POA: Insufficient documentation

## 2017-12-25 DIAGNOSIS — K859 Acute pancreatitis without necrosis or infection, unspecified: Secondary | ICD-10-CM | POA: Insufficient documentation

## 2017-12-25 DIAGNOSIS — R1013 Epigastric pain: Secondary | ICD-10-CM | POA: Insufficient documentation

## 2017-12-25 DIAGNOSIS — F17210 Nicotine dependence, cigarettes, uncomplicated: Secondary | ICD-10-CM | POA: Diagnosis not present

## 2017-12-25 LAB — I-STAT BETA HCG BLOOD, ED (MC, WL, AP ONLY): I-stat hCG, quantitative: <5 m[IU]/mL (ref <5)

## 2017-12-25 LAB — CBC
HCT: 41.8 % (ref 36.0–46.0)
Hemoglobin: 14.4 g/dL (ref 12.0–15.0)
MCH: 30.1 pg (ref 26.0–34.0)
MCHC: 34.4 g/dL (ref 30.0–36.0)
MCV: 87.3 fL (ref 78.0–100.0)
PLATELETS: 217 10*3/uL (ref 150–400)
RBC: 4.79 MIL/uL (ref 3.87–5.11)
RDW: 15.4 % (ref 11.5–15.5)
WBC: 10.5 10*3/uL (ref 4.0–10.5)

## 2017-12-25 LAB — URINALYSIS, ROUTINE W REFLEX MICROSCOPIC
Bacteria, UA: NONE SEEN
Bilirubin Urine: NEGATIVE
Hgb urine dipstick: NEGATIVE
KETONES UR: NEGATIVE mg/dL
Leukocytes, UA: NEGATIVE
NITRITE: NEGATIVE
PROTEIN: NEGATIVE mg/dL
Specific Gravity, Urine: 1.032 — ABNORMAL HIGH (ref 1.005–1.030)
pH: 7 (ref 5.0–8.0)

## 2017-12-25 LAB — COMPREHENSIVE METABOLIC PANEL
ALT: 51 U/L — AB (ref 0–44)
AST: 61 U/L — ABNORMAL HIGH (ref 15–41)
Albumin: 4.1 g/dL (ref 3.5–5.0)
Alkaline Phosphatase: 362 U/L — ABNORMAL HIGH (ref 38–126)
Anion gap: 11 (ref 5–15)
BUN: 6 mg/dL (ref 6–20)
CALCIUM: 9.7 mg/dL (ref 8.9–10.3)
CHLORIDE: 100 mmol/L (ref 98–111)
CO2: 26 mmol/L (ref 22–32)
CREATININE: 0.56 mg/dL (ref 0.44–1.00)
Glucose, Bld: 333 mg/dL — ABNORMAL HIGH (ref 70–99)
Potassium: 3.6 mmol/L (ref 3.5–5.1)
Sodium: 137 mmol/L (ref 135–145)
Total Bilirubin: 0.3 mg/dL (ref 0.3–1.2)
Total Protein: 7.7 g/dL (ref 6.5–8.1)

## 2017-12-25 LAB — LIPASE, BLOOD: LIPASE: 155 U/L — AB (ref 11–51)

## 2017-12-25 MED ORDER — ONDANSETRON HCL 4 MG/2ML IJ SOLN
4.0000 mg | Freq: Once | INTRAMUSCULAR | Status: AC
  Administered 2017-12-25: 4 mg via INTRAVENOUS
  Filled 2017-12-25: qty 2

## 2017-12-25 MED ORDER — SODIUM CHLORIDE 0.9 % IV BOLUS
1000.0000 mL | Freq: Once | INTRAVENOUS | Status: AC
  Administered 2017-12-25: 1000 mL via INTRAVENOUS

## 2017-12-25 MED ORDER — ONDANSETRON 4 MG PO TBDP: 4.0000 mg | ORAL_TABLET | Freq: Three times a day (TID) | ORAL | 0 refills | Status: DC | PRN

## 2017-12-25 MED ORDER — FAMOTIDINE IN NACL 20-0.9 MG/50ML-% IV SOLN
20.0000 mg | Freq: Once | INTRAVENOUS | Status: AC
  Administered 2017-12-25: 20 mg via INTRAVENOUS
  Filled 2017-12-25: qty 50

## 2017-12-25 MED ORDER — HYDROMORPHONE HCL 1 MG/ML IJ SOLN
0.5000 mg | Freq: Once | INTRAMUSCULAR | Status: AC
  Administered 2017-12-25: 0.5 mg via INTRAVENOUS
  Filled 2017-12-25: qty 1

## 2017-12-25 MED ORDER — HYDROMORPHONE HCL 1 MG/ML IJ SOLN
1.0000 mg | Freq: Once | INTRAMUSCULAR | Status: AC
  Administered 2017-12-25: 1 mg via INTRAVENOUS
  Filled 2017-12-25: qty 1

## 2017-12-25 NOTE — ED Provider Notes (Signed)
Emergency Department Provider Note   I have reviewed the triage vital signs and the nursing notes.   HISTORY  Chief Complaint Abdominal Pain   HPI Heather Hunt is a 37 y.o. female with PMH of DM and chronic pancreatitis presents to the emergency department for evaluation of abdominal pain with nausea, diarrhea, and abdominal cramping.  Patient states symptoms began 2-3 days ago. No bloody diarrhea. No fever or chills. Patient's pain is primarily epigastric and feels like prior pancreatitis flares. No CP or SOB.    Past Medical History:  Diagnosis Date  . Diabetes mellitus without complication ([facility])   . Pancreatitis     There are no active problems to display for this patient.   Past Surgical History:  Procedure Laterality Date  . BILE DUCT STENT PLACEMENT    . KNEE SURGERY    . pancreas stent      Allergies Morphine and related  No family history on file.  Social History Social History   Tobacco Use  . Smoking status: Current Every Day Smoker    Packs/day: 0.50    Types: Cigarettes  . Smokeless tobacco: Never Used  Substance Use Topics  . Alcohol use: Not Currently  . Drug use: Never    Review of Systems  Constitutional: No fever/chills Eyes: No visual changes. ENT: No sore throat. Cardiovascular: Denies chest pain. Respiratory: Denies shortness of breath. Gastrointestinal: Positive epigastric abdominal pain. Positive nausea, no vomiting.  Positive diarrhea.  No constipation. Genitourinary: Negative for dysuria. Musculoskeletal: Negative for back pain. Skin: Negative for rash. Neurological: Negative for headaches, focal weakness or numbness.  10-point ROS otherwise negative.  ____________________________________________   PHYSICAL EXAM:  VITAL SIGNS: ED Triage Vitals  Enc Vitals Group     BP 12/25/17 1806 102/74     Pulse Rate 12/25/17 1806 83     Resp 12/25/17 1806 17     Temp 12/25/17 1806 98.2 F (36.8 C)     Temp Source 12/25/17  1806 Oral     SpO2 12/25/17 1806 99 %     Weight 12/25/17 1817 125 lb (56.7 kg)     Height 12/25/17 1817 5\' 6"  (1.676 m)     Pain Score 12/25/17 1817 8    Constitutional: Alert and oriented. Well appearing and in no acute distress. Eyes: Conjunctivae are normal.  Head: Atraumatic. Nose: No congestion/rhinnorhea. Mouth/Throat: Mucous membranes are moist. Neck: No stridor.   Cardiovascular: Normal rate, regular rhythm. Good peripheral circulation. Grossly normal heart sounds.   Respiratory: Normal respiratory effort.  No retractions. Lungs CTAB. Gastrointestinal: Soft with focal epigastric tenderness. No lower abdominal tenderness. No rebound or guarding. No distention.  Musculoskeletal: No lower extremity tenderness nor edema. No gross deformities of extremities. Neurologic:  Normal speech and language. No gross focal neurologic deficits are appreciated.  Skin:  Skin is warm, dry and intact. No rash noted.  ____________________________________________   LABS (all labs ordered are listed, but only abnormal results are displayed)  Labs Reviewed  LIPASE, BLOOD - Abnormal; Notable for the following components:      Result Value   Lipase 155 (*)    All other components within normal limits  COMPREHENSIVE METABOLIC PANEL - Abnormal; Notable for the following components:   Glucose, Bld 333 (*)    AST 61 (*)    ALT 51 (*)    Alkaline Phosphatase 362 (*)    All other components within normal limits  URINALYSIS, ROUTINE W REFLEX MICROSCOPIC - Abnormal; Notable for the  following components:   Specific Gravity, Urine 1.032 (*)    Glucose, UA >=500 (*)    All other components within normal limits  CBC  I-STAT BETA HCG BLOOD, ED ([facility], [facility], [facility] ONLY)   ____________________________________________  RADIOLOGY  None ____________________________________________   PROCEDURES  Procedure(s) performed:   Procedures  None ____________________________________________   INITIAL  IMPRESSION / ASSESSMENT AND PLAN / ED COURSE  Pertinent labs & imaging results that were available during my care of the patient were reviewed by me and considered in my medical decision making (see chart for details).  She with history of chronic pancreatitis presents to the emergency department with pain similar to her pancreatitis discomfort.  Patient with mild elevated lipase.  No leukocytosis.  No fevers, tachycardia, or hypotension.  Patient does have some epigastric tenderness but no peritoneal findings.  No lower abdominal discomfort.  Plan for IV fluids, pain/nausea medication, and reassess.  10:50 PM Patient with some relief in symptoms. Plan for additional pain medication and then likely discharge.   At this time, I do not feel there is any life-threatening condition present. I have reviewed and discussed all results (EKG, imaging, lab, urine as appropriate), exam findings with patient. I have reviewed nursing notes and appropriate previous records.  I feel the patient is safe to be discharged home without further emergent workup. Discussed usual and customary return precautions. Patient and family (if present) verbalize understanding and are comfortable with this plan.  Patient will follow-up with their primary care provider. If they do not have a primary care provider, information for follow-up has been provided to them. All questions have been answered.   ____________________________________________  FINAL CLINICAL IMPRESSION(S) / ED DIAGNOSES  Final diagnoses:  Epigastric pain  Acute pancreatitis, unspecified complication status, unspecified pancreatitis type     MEDICATIONS GIVEN DURING THIS VISIT:  Medications  sodium chloride 0.9 % bolus 1,000 mL (0 mLs Intravenous Stopped 12/25/17 2340)  HYDROmorphone (DILAUDID) injection 1 mg (1 mg Intravenous Given 12/25/17 2135)  ondansetron (ZOFRAN) injection 4 mg (4 mg Intravenous Given 12/25/17 2132)  famotidine (PEPCID) IVPB 20 mg  premix (0 mg Intravenous Stopped 12/25/17 2209)  HYDROmorphone (DILAUDID) injection 0.5 mg (0.5 mg Intravenous Given 12/25/17 2308)    Note:  This document was prepared using Dragon voice recognition software and may include unintentional dictation errors.  Alona BeneJoshua Long, MD Emergency Medicine    Long, Arlyss RepressJoshua G, MD 12/26/17 [phone]

## 2017-12-25 NOTE — ED Triage Notes (Signed)
Pt states that she is having abdominal pain, mostly LUQ. Pt is concerned for pancreatitis, as she has a history. Pt states pain started last night. Pt states she has had nausea and diarrhea.

## 2017-12-25 NOTE — Discharge Instructions (Signed)

## 2018-01-13 ENCOUNTER — Emergency Department (HOSPITAL_COMMUNITY)
Admission: EM | Admit: 2018-01-13 | Discharge: 2018-01-13 | Disposition: A | Discharge status: Home / Self Care | Payer: BLUE CROSS/BLUE SHIELD | Attending: Emergency Medicine | Admitting: Emergency Medicine

## 2018-01-13 ENCOUNTER — Emergency Department (HOSPITAL_COMMUNITY): Payer: BLUE CROSS/BLUE SHIELD

## 2018-01-13 ENCOUNTER — Encounter (HOSPITAL_COMMUNITY): Payer: Self-pay

## 2018-01-13 DIAGNOSIS — F17210 Nicotine dependence, cigarettes, uncomplicated: Secondary | ICD-10-CM | POA: Insufficient documentation

## 2018-01-13 DIAGNOSIS — Z794 Long term (current) use of insulin: Secondary | ICD-10-CM | POA: Insufficient documentation

## 2018-01-13 DIAGNOSIS — R1013 Epigastric pain: Secondary | ICD-10-CM | POA: Diagnosis present

## 2018-01-13 DIAGNOSIS — K852 Alcohol induced acute pancreatitis without necrosis or infection: Secondary | ICD-10-CM

## 2018-01-13 DIAGNOSIS — E119 Type 2 diabetes mellitus without complications: Secondary | ICD-10-CM | POA: Insufficient documentation

## 2018-01-13 DIAGNOSIS — Z79899 Other long term (current) drug therapy: Secondary | ICD-10-CM | POA: Insufficient documentation

## 2018-01-13 LAB — URINALYSIS, ROUTINE W REFLEX MICROSCOPIC
BACTERIA UA: NONE SEEN
Bilirubin Urine: NEGATIVE
Glucose, UA: >=500 mg/dL — AB
Hgb urine dipstick: NEGATIVE
KETONES UR: NEGATIVE mg/dL
Leukocytes, UA: NEGATIVE
Nitrite: NEGATIVE
Protein, ur: NEGATIVE mg/dL
Specific Gravity, Urine: >1.046 — ABNORMAL HIGH (ref 1.005–1.030)
pH: 6 (ref 5.0–8.0)

## 2018-01-13 LAB — DIFFERENTIAL
BASOS PCT: 0 %
Basophils Absolute: 0 10*3/uL (ref 0.0–0.1)
EOS ABS: 0.2 10*3/uL (ref 0.0–0.7)
EOS PCT: 2 %
LYMPHS PCT: 38 %
Lymphs Abs: 3.5 10*3/uL (ref 0.7–4.0)
Monocytes Absolute: 0.6 10*3/uL (ref 0.1–1.0)
Monocytes Relative: 7 %
NEUTROS PCT: 53 %
Neutro Abs: 4.8 10*3/uL (ref 1.7–7.7)

## 2018-01-13 LAB — COMPREHENSIVE METABOLIC PANEL
ALT: 21 U/L (ref 0–44)
AST: 20 U/L (ref 15–41)
Albumin: 3.7 g/dL (ref 3.5–5.0)
Alkaline Phosphatase: 194 U/L — ABNORMAL HIGH (ref 38–126)
Anion gap: 11 (ref 5–15)
BUN: 8 mg/dL (ref 6–20)
CHLORIDE: 98 mmol/L (ref 98–111)
CO2: 27 mmol/L (ref 22–32)
CREATININE: 0.79 mg/dL (ref 0.44–1.00)
Calcium: 8.9 mg/dL (ref 8.9–10.3)
GFR calc non Af Amer: >60 mL/min (ref >60)
Glucose, Bld: 400 mg/dL — ABNORMAL HIGH (ref 70–99)
Potassium: 2.8 mmol/L — ABNORMAL LOW (ref 3.5–5.1)
SODIUM: 136 mmol/L (ref 135–145)
Total Bilirubin: 0.4 mg/dL (ref 0.3–1.2)
Total Protein: 7 g/dL (ref 6.5–8.1)

## 2018-01-13 LAB — CBG MONITORING, ED: GLUCOSE-CAPILLARY: 498 mg/dL — AB (ref 70–99)

## 2018-01-13 LAB — LIPASE, BLOOD: LIPASE: 94 U/L — AB (ref 11–51)

## 2018-01-13 LAB — CBC
HCT: 40.1 % (ref 36.0–46.0)
Hemoglobin: 13.5 g/dL (ref 12.0–15.0)
MCH: 29.5 pg (ref 26.0–34.0)
MCHC: 33.7 g/dL (ref 30.0–36.0)
MCV: 87.6 fL (ref 78.0–100.0)
Platelets: 187 10*3/uL (ref 150–400)
RBC: 4.58 MIL/uL (ref 3.87–5.11)
RDW: 14.5 % (ref 11.5–15.5)
WBC: 9.4 10*3/uL (ref 4.0–10.5)

## 2018-01-13 LAB — I-STAT BETA HCG BLOOD, ED (MC, WL, AP ONLY): I-stat hCG, quantitative: <5 m[IU]/mL (ref <5)

## 2018-01-13 MED ORDER — HYDROMORPHONE HCL 1 MG/ML IJ SOLN
1.0000 mg | Freq: Once | INTRAMUSCULAR | Status: AC
  Administered 2018-01-13: 1 mg via INTRAVENOUS
  Filled 2018-01-13: qty 1

## 2018-01-13 MED ORDER — ONDANSETRON HCL 4 MG/2ML IJ SOLN
4.0000 mg | Freq: Once | INTRAMUSCULAR | Status: AC
  Administered 2018-01-13: 4 mg via INTRAVENOUS
  Filled 2018-01-13: qty 2

## 2018-01-13 MED ORDER — HYDROMORPHONE HCL 1 MG/ML IJ SOLN
1.0000 mg | Freq: Once | INTRAMUSCULAR | Status: AC
Start: 2018-01-13 — End: 2018-01-13
  Administered 2018-01-13: 1 mg via INTRAVENOUS
  Filled 2018-01-13: qty 1

## 2018-01-13 MED ORDER — ONDANSETRON 4 MG PO TBDP: ORAL_TABLET | ORAL | 0 refills | Status: DC

## 2018-01-13 MED ORDER — HYDROCODONE-ACETAMINOPHEN 5-325 MG PO TABS: 1.0000 | ORAL_TABLET | Freq: Four times a day (QID) | ORAL | 0 refills | Status: DC | PRN

## 2018-01-13 MED ORDER — IOPAMIDOL (ISOVUE-300) INJECTION 61%
INTRAVENOUS | Status: AC
  Filled 2018-01-13: qty 100

## 2018-01-13 MED ORDER — IOPAMIDOL (ISOVUE-300) INJECTION 61%
100.0000 mL | Freq: Once | INTRAVENOUS | Status: AC | PRN
  Administered 2018-01-13: 100 mL via INTRAVENOUS

## 2018-01-13 MED ORDER — SODIUM CHLORIDE 0.9 % IV BOLUS
2000.0000 mL | Freq: Once | INTRAVENOUS | Status: AC
  Administered 2018-01-13: 2000 mL via INTRAVENOUS

## 2018-01-13 MED ORDER — POTASSIUM CHLORIDE CRYS ER 20 MEQ PO TBCR
40.0000 meq | EXTENDED_RELEASE_TABLET | Freq: Once | ORAL | Status: AC
  Administered 2018-01-13: 40 meq via ORAL
  Filled 2018-01-13: qty 2

## 2018-01-13 MED ORDER — POTASSIUM CHLORIDE ER 10 MEQ PO TBCR: 10.0000 meq | EXTENDED_RELEASE_TABLET | Freq: Every day | ORAL | 0 refills | Status: AC

## 2018-01-13 NOTE — ED Triage Notes (Signed)
Pt presents with c/o abdominal pain and hyperglycemia. Pt reports she has a hx of pancreatitis. Pt also reports that she checked her blood sugar at home and it was 444.

## 2018-01-13 NOTE — Discharge Instructions (Addendum)
Drink plenty of fluids,  follow up with [facility] in 1-2 weeks.  Return if not getting better

## 2018-01-13 NOTE — ED Notes (Signed)
Pt's CBG at this time = 329.

## 2018-01-13 NOTE — ED Provider Notes (Signed)
[facility] DEPT Provider Note   CSN: [id number] Arrival date & time: 01/13/18  1729     History   Chief Complaint Chief Complaint  Patient presents with  . Abdominal Pain  . Hyperglycemia    HPI Heather Hunt is a 37 y.o. female.  Patient complains of abdominal pain and vomiting.  Patient has a history of pancreatitis.  She states she no longer drinks.  The history is provided by the patient. No language interpreter was used.  Abdominal Pain   This is a recurrent problem. The current episode started 2 days ago. The problem occurs constantly. The problem has not changed since onset.The pain is associated with an unknown factor. The pain is located in the epigastric region. The quality of the pain is aching. The pain is at a severity of 5/10. The pain is moderate. Associated symptoms include vomiting. Pertinent negatives include anorexia, diarrhea, frequency, hematuria and headaches. Nothing aggravates the symptoms. Nothing relieves the symptoms. Past workup includes GI consult. Past medical history comments: Pancreatitis.    Past Medical History:  Diagnosis Date  . Diabetes mellitus without complication ([place])   . Pancreatitis     There are no active problems to display for this patient.   Past Surgical History:  Procedure Laterality Date  . BILE DUCT STENT PLACEMENT    . KNEE SURGERY    . pancreas stent       OB History   None      Home Medications    Prior to Admission medications   Medication Sig Start Date End Date Taking? Authorizing Provider  escitalopram (LEXAPRO) 10 MG tablet Take 10 mg by mouth daily.    Yes [provider]  fentaNYL (DURAGESIC - DOSED MCG/HR) 25 MCG/HR patch Place 25 mcg onto the skin every 3 (three) days.    Yes [provider]  insulin degludec (TRESIBA FLEXTOUCH) 100 UNIT/ML SOPN FlexTouch Pen Inject 26 Units into the skin daily. 08/03/17  Yes [provider]  insulin regular  (HUMULIN R) 100 units/mL injection Inject 1-10 Units into the skin 3 (three) times daily before meals. Sliding scale  03/04/17  Yes [provider]  lipase/protease/amylase (CREON) 12000 units CPEP capsule Take 2 capsules by mouth 3 (three) times daily.   Yes [provider]  pantoprazole (PROTONIX) 40 MG tablet Take 40 mg by mouth daily. 10/12/17  Yes [provider]  pregabalin (LYRICA) 50 MG capsule Take 50 mg by mouth 3 (three) times daily.   Yes [provider]  promethazine (PHENERGAN) 12.5 MG tablet Take 12.5 mg by mouth every 6 (six) hours as needed for nausea or vomiting.  11/11/17  Yes [provider]  blood glucose meter kit and supplies KIT Dispense based on patient and insurance preference. Use up to four times daily as directed. (FOR ICD-9 250.00, 250.01). 12/07/17   Waynetta Pean, PA-C  fentaNYL (DURAGESIC - DOSED MCG/HR) 12 MCG/HR Place 12.5 mcg onto the skin every 3 (three) days.     [provider]  HYDROcodone-acetaminophen (NORCO/VICODIN) 5-325 MG tablet Take 1 tablet by mouth every 6 (six) hours as needed for moderate pain. 01/13/18   Milton Ferguson, MD  ondansetron (ZOFRAN ODT) 4 MG disintegrating tablet 16m ODT q4 hours prn nausea/vomit 01/13/18   ZMilton Ferguson MD  potassium chloride (K-DUR) 10 MEQ tablet Take 1 tablet (10 mEq total) by mouth daily. 01/13/18   ZMilton Ferguson MD    Family History History reviewed. No pertinent family history.  Social History Social History   Tobacco Use  . Smoking status: Current Every Day Smoker    Packs/day: 0.50    Types: Cigarettes  . Smokeless tobacco: Never Used  Substance Use Topics  . Alcohol use: Not Currently  . Drug use: Never     Allergies   Morphine and related   Review of Systems Review of Systems  Constitutional: Negative for appetite change and fatigue.  HENT: Negative for congestion, ear discharge and sinus pressure.   Eyes: Negative for discharge.    Respiratory: Negative for cough.   Cardiovascular: Negative for chest pain.  Gastrointestinal: Positive for abdominal pain and vomiting. Negative for anorexia and diarrhea.  Genitourinary: Negative for frequency and hematuria.  Musculoskeletal: Negative for back pain.  Skin: Negative for rash.  Neurological: Negative for seizures and headaches.  Psychiatric/Behavioral: Negative for hallucinations.     Physical Exam Updated Vital Signs BP 106/76   Pulse 85   Temp 98.8 F (37.1 C) (Oral)   Resp 18   Ht _0  (1.676 m)   Wt 56.7 kg   SpO2 96%   BMI 20.18 kg/m   Physical Exam  Constitutional: She is oriented to person, place, and time. She appears well-developed.  HENT:  Head: Normocephalic.  Eyes: Conjunctivae and EOM are normal. No scleral icterus.  Neck: Neck supple. No thyromegaly present.  Cardiovascular: Normal rate and regular rhythm. Exam reveals no gallop and no friction rub.  No murmur heard. Pulmonary/Chest: No stridor. She has no wheezes. She has no rales. She exhibits no tenderness.  Abdominal: She exhibits no distension. There is tenderness. There is no rebound.  Musculoskeletal: Normal range of motion. She exhibits no edema.  Lymphadenopathy:    She has no cervical adenopathy.  Neurological: She is oriented to person, place, and time. She exhibits normal muscle tone. Coordination normal.  Skin: No rash noted. No erythema.  Psychiatric: She has a normal mood and affect. Her behavior is normal.     ED Treatments / Results  Labs (all labs ordered are listed, but only abnormal results are displayed) Labs Reviewed  LIPASE, BLOOD - Abnormal; Notable for the following components:      Result Value   Lipase 94 (*)    All other components within normal limits  COMPREHENSIVE METABOLIC PANEL - Abnormal; Notable for the following components:   Potassium 2.8 (*)    Glucose, Bld 400 (*)    Alkaline Phosphatase 194 (*)    All other components within normal limits   URINALYSIS, ROUTINE W REFLEX MICROSCOPIC - Abnormal; Notable for the following components:   Specific Gravity, Urine >1.046 (*)    Glucose, UA >=500 (*)    All other components within normal limits  CBG MONITORING, ED - Abnormal; Notable for the following components:   Glucose-Capillary 498 (*)    All other components within normal limits  CBC  DIFFERENTIAL  I-STAT BETA HCG BLOOD, ED ([facility], [facility], [facility] ONLY)  CBG MONITORING, ED    EKG None  Radiology Ct Abdomen Pelvis W Contrast  Result Date: 01/13/2018 CLINICAL DATA:  Abdominal pain and hyperglycemia. History of pancreatitis. Elevated blood sugar. EXAM: CT ABDOMEN AND PELVIS WITH CONTRAST TECHNIQUE: Multidetector CT imaging of the abdomen and pelvis was performed using the standard protocol following bolus administration of intravenous contrast. CONTRAST:  1104m ISOVUE-300 IOPAMIDOL (ISOVUE-300) INJECTION 61% COMPARISON:  None. FINDINGS: Lower chest: Mild dependent changes in the lung bases. Hepatobiliary: Surgical absence of the gallbladder. There is mild bile duct dilatation  with a stent in the distal common bile duct. No focal liver lesions. Pancreas: Diffuse pancreatic edema with decreased parenchymal enhancement throughout the pancreas. Pancreatic ductal dilatation with a pancreatic stent in place. Peripancreatic edema. Multiple small loculated fluid collections along the course of the body and head of the pancreas. Largest is anterior to the head and measures about 2.3 cm in diameter. Multiple pancreatic calcifications. Changes are consistent with acute on chronic pancreatitis with evidence of parenchymal necrosis and areas of head off necrosis. Spleen: Normal in size without focal abnormality. Adrenals/Urinary Tract: No adrenal gland nodules. Kidneys are symmetrical. Small cyst on the left kidney. Nephrograms are homogeneous. No hydronephrosis or hydroureter. Bladder is mildly distended without wall thickening or filling defect. This could be  physiologic or due to urinary retention. Stomach/Bowel: Stomach, small bowel, and colon are not abnormally distended. Gas and stool throughout the colon. No wall thickening or inflammatory changes are appreciated. Appendix is normal. Vascular/Lymphatic: Normal caliber abdominal aorta with minimal calcification. Retroperitoneal lymph nodes are mildly prominent with periaortic nodes measuring up to about 1.3 cm in maximal diameter. This is nonspecific but likely reactive. Reproductive: Uterus is not enlarged. Left ovarian simple appearing cyst measuring 5.1 cm in diameter. Subcentimeter cyst in the vaginal region likely Bartholin cyst. Other: No free air or free fluid in the abdomen. Abdominal wall musculature appears intact. Musculoskeletal: No acute or significant osseous findings. IMPRESSION: 1. Changes of acute on chronic pancreatitis with multiple small loculated fluid collections along the course of the body and head of the pancreas. Pancreatic ductal dilatation with pancreatic stent in place. 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 2. Surgical absence of the gallbladder with mild bile duct dilatation. Bile duct stent in place. 3. Mild retroperitoneal lymphadenopathy, likely reactive. 4. 5.1 cm simple appearing cyst in the left ovary. Due to borderline size, consider ultrasound follow-up in 6-12 weeks. Electronically Signed   By: Lucienne Capers M.D.   On: 01/13/2018 21:24    Procedures Procedures (including critical care time)  Medications Ordered in ED Medications  iopamidol (ISOVUE-300) 61 % injection (has no administration in time range)  sodium chloride 0.9 % bolus 2,000 mL (0 mLs Intravenous Stopped 01/13/18 2229)  HYDROmorphone (DILAUDID) injection 1 mg (1 mg Intravenous Given 01/13/18 1930)  ondansetron (ZOFRAN) injection 4 mg (4 mg Intravenous Given 01/13/18 1930)  iopamidol (ISOVUE-300) 61 % injection 100 mL (100 mLs Intravenous Contrast Given 01/13/18  2036)  HYDROmorphone (DILAUDID) injection 1 mg (1 mg Intravenous Given 01/13/18 2133)  ondansetron (ZOFRAN) injection 4 mg (4 mg Intravenous Given 01/13/18 2133)  potassium chloride SA (K-DUR,KLOR-CON) CR tablet 40 mEq (40 mEq Oral Given 01/13/18 2227)     Initial Impression / Assessment and Plan / ED Course  I have reviewed the triage vital signs and the nursing notes.  Pertinent labs & imaging results that were available during my care of the patient were reviewed by me and considered in my medical decision making (see chart for details).     Patient with flareup of pancreatitis.  Patient also has hypokalemia and hyperglycemia.  Patient improved somewhat fluids and pain medicine.  I recommended admission to the hospital to treat her pancreatitis and hypokalemia but patient refused.  She is discharged home with some potassium Zofran and pain medicine and will follow-up with gastroenterology  Final Clinical Impressions(s) / ED Diagnoses   Final diagnoses:  Alcohol-induced acute pancreatitis without infection or necrosis    ED Discharge Orders  Ordered    HYDROcodone-acetaminophen (NORCO/VICODIN) 5-325 MG tablet  Every 6 hours PRN     01/13/18 2310    ondansetron (ZOFRAN ODT) 4 MG disintegrating tablet     01/13/18 2310    potassium chloride (K-DUR) 10 MEQ tablet  Daily     01/13/18 2310           Milton Ferguson, MD 01/13/18 2315

## 2018-01-14 LAB — CBG MONITORING, ED: Glucose-Capillary: 329 mg/dL — ABNORMAL HIGH (ref 70–99)

## 2018-01-15 ENCOUNTER — Inpatient Hospital Stay (HOSPITAL_COMMUNITY)
Admission: EM | Admit: 2018-01-15 | Discharge: 2018-01-17 | DRG: 439 | Disposition: A | Discharge status: Home / Self Care | Payer: BLUE CROSS/BLUE SHIELD | Attending: Internal Medicine | Admitting: Internal Medicine

## 2018-01-15 ENCOUNTER — Emergency Department (HOSPITAL_COMMUNITY): Payer: BLUE CROSS/BLUE SHIELD

## 2018-01-15 ENCOUNTER — Encounter (HOSPITAL_COMMUNITY): Payer: Self-pay

## 2018-01-15 ENCOUNTER — Other Ambulatory Visit: Payer: Self-pay

## 2018-01-15 DIAGNOSIS — K859 Acute pancreatitis without necrosis or infection, unspecified: Secondary | ICD-10-CM | POA: Diagnosis not present

## 2018-01-15 DIAGNOSIS — Z885 Allergy status to narcotic agent status: Secondary | ICD-10-CM

## 2018-01-15 DIAGNOSIS — Z6821 Body mass index (BMI) 21.0-21.9, adult: Secondary | ICD-10-CM

## 2018-01-15 DIAGNOSIS — E876 Hypokalemia: Secondary | ICD-10-CM | POA: Diagnosis present

## 2018-01-15 DIAGNOSIS — R1115 Cyclical vomiting syndrome unrelated to migraine: Secondary | ICD-10-CM

## 2018-01-15 DIAGNOSIS — IMO0001 Reserved for inherently not codable concepts without codable children: Secondary | ICD-10-CM

## 2018-01-15 DIAGNOSIS — R739 Hyperglycemia, unspecified: Secondary | ICD-10-CM

## 2018-01-15 DIAGNOSIS — F17210 Nicotine dependence, cigarettes, uncomplicated: Secondary | ICD-10-CM | POA: Diagnosis present

## 2018-01-15 DIAGNOSIS — E119 Type 2 diabetes mellitus without complications: Secondary | ICD-10-CM | POA: Diagnosis not present

## 2018-01-15 DIAGNOSIS — G894 Chronic pain syndrome: Secondary | ICD-10-CM | POA: Diagnosis not present

## 2018-01-15 DIAGNOSIS — Z794 Long term (current) use of insulin: Secondary | ICD-10-CM

## 2018-01-15 DIAGNOSIS — E1065 Type 1 diabetes mellitus with hyperglycemia: Secondary | ICD-10-CM | POA: Diagnosis present

## 2018-01-15 DIAGNOSIS — K8681 Exocrine pancreatic insufficiency: Secondary | ICD-10-CM | POA: Diagnosis present

## 2018-01-15 DIAGNOSIS — K861 Other chronic pancreatitis: Secondary | ICD-10-CM | POA: Diagnosis present

## 2018-01-15 DIAGNOSIS — Z79899 Other long term (current) drug therapy: Secondary | ICD-10-CM

## 2018-01-15 DIAGNOSIS — E440 Moderate protein-calorie malnutrition: Secondary | ICD-10-CM | POA: Diagnosis present

## 2018-01-15 LAB — CBG MONITORING, ED
GLUCOSE-CAPILLARY: 539 mg/dL — AB (ref 70–99)
Glucose-Capillary: 390 mg/dL — ABNORMAL HIGH (ref 70–99)
Glucose-Capillary: 421 mg/dL — ABNORMAL HIGH (ref 70–99)

## 2018-01-15 LAB — URINALYSIS, ROUTINE W REFLEX MICROSCOPIC
Bilirubin Urine: NEGATIVE
Hgb urine dipstick: NEGATIVE
Ketones, ur: 20 mg/dL — AB
Leukocytes, UA: NEGATIVE
Nitrite: NEGATIVE
PH: 7 (ref 5.0–8.0)
PROTEIN: NEGATIVE mg/dL
Specific Gravity, Urine: 1.033 — ABNORMAL HIGH (ref 1.005–1.030)

## 2018-01-15 LAB — COMPREHENSIVE METABOLIC PANEL
ALBUMIN: 3.5 g/dL (ref 3.5–5.0)
ALT: 23 U/L (ref 0–44)
AST: 25 U/L (ref 15–41)
Alkaline Phosphatase: 183 U/L — ABNORMAL HIGH (ref 38–126)
Anion gap: 11 (ref 5–15)
BUN: 6 mg/dL (ref 6–20)
CHLORIDE: 98 mmol/L (ref 98–111)
CO2: 26 mmol/L (ref 22–32)
CREATININE: 0.62 mg/dL (ref 0.44–1.00)
Calcium: 8.6 mg/dL — ABNORMAL LOW (ref 8.9–10.3)
GFR calc Af Amer: >60 mL/min (ref >60)
GFR calc non Af Amer: >60 mL/min (ref >60)
GLUCOSE: 555 mg/dL — AB (ref 70–99)
Potassium: 3.8 mmol/L (ref 3.5–5.1)
Sodium: 135 mmol/L (ref 135–145)
Total Bilirubin: 0.7 mg/dL (ref 0.3–1.2)
Total Protein: 6.6 g/dL (ref 6.5–8.1)

## 2018-01-15 LAB — CBC
HEMATOCRIT: 39.1 % (ref 36.0–46.0)
Hemoglobin: 12.9 g/dL (ref 12.0–15.0)
MCH: 29.5 pg (ref 26.0–34.0)
MCHC: 33 g/dL (ref 30.0–36.0)
MCV: 89.5 fL (ref 78.0–100.0)
PLATELETS: 147 10*3/uL — AB (ref 150–400)
RBC: 4.37 MIL/uL (ref 3.87–5.11)
RDW: 14.6 % (ref 11.5–15.5)
WBC: 8.1 10*3/uL (ref 4.0–10.5)

## 2018-01-15 LAB — GLUCOSE, CAPILLARY: Glucose-Capillary: 345 mg/dL — ABNORMAL HIGH (ref 70–99)

## 2018-01-15 LAB — I-STAT BETA HCG BLOOD, ED (MC, WL, AP ONLY): I-stat hCG, quantitative: <5 m[IU]/mL (ref <5)

## 2018-01-15 LAB — LIPASE, BLOOD: LIPASE: 136 U/L — AB (ref 11–51)

## 2018-01-15 MED ORDER — INSULIN GLARGINE 100 UNIT/ML ~~LOC~~ SOLN
20.0000 [IU] | Freq: Every day | SUBCUTANEOUS | Status: DC
  Administered 2018-01-16 – 2018-01-17 (×2): 20 [IU] via SUBCUTANEOUS
  Filled 2018-01-15 (×2): qty 0.2

## 2018-01-15 MED ORDER — FENTANYL 12 MCG/HR TD PT72: 12.5000 ug | MEDICATED_PATCH | TRANSDERMAL | Status: DC

## 2018-01-15 MED ORDER — ACETAMINOPHEN 650 MG RE SUPP: 650.0000 mg | Freq: Four times a day (QID) | RECTAL | Status: DC | PRN

## 2018-01-15 MED ORDER — SODIUM CHLORIDE 0.9 % IV SOLN
INTRAVENOUS | Status: DC
  Administered 2018-01-15 – 2018-01-17 (×4): via INTRAVENOUS

## 2018-01-15 MED ORDER — HYDROMORPHONE HCL 1 MG/ML IJ SOLN
1.0000 mg | Freq: Once | INTRAMUSCULAR | Status: AC
  Administered 2018-01-15: 1 mg via INTRAVENOUS
  Filled 2018-01-15: qty 1

## 2018-01-15 MED ORDER — PREGABALIN 50 MG PO CAPS
50.0000 mg | ORAL_CAPSULE | Freq: Three times a day (TID) | ORAL | Status: DC
  Administered 2018-01-15 – 2018-01-17 (×5): 50 mg via ORAL
  Filled 2018-01-15 (×5): qty 1

## 2018-01-15 MED ORDER — FENTANYL 25 MCG/HR TD PT72
25.0000 ug | MEDICATED_PATCH | TRANSDERMAL | Status: DC
  Administered 2018-01-15: 25 ug via TRANSDERMAL
  Filled 2018-01-15: qty 1

## 2018-01-15 MED ORDER — SODIUM CHLORIDE 0.9 % IV BOLUS (SEPSIS)
1000.0000 mL | Freq: Once | INTRAVENOUS | Status: AC
  Administered 2018-01-15: 1000 mL via INTRAVENOUS

## 2018-01-15 MED ORDER — INSULIN ASPART 100 UNIT/ML ~~LOC~~ SOLN
8.0000 [IU] | Freq: Once | SUBCUTANEOUS | Status: AC
  Administered 2018-01-15: 8 [IU] via SUBCUTANEOUS
  Filled 2018-01-15: qty 1

## 2018-01-15 MED ORDER — ESCITALOPRAM OXALATE 10 MG PO TABS
10.0000 mg | ORAL_TABLET | Freq: Every day | ORAL | Status: DC
  Administered 2018-01-16 – 2018-01-17 (×2): 10 mg via ORAL
  Filled 2018-01-15 (×2): qty 1

## 2018-01-15 MED ORDER — ENOXAPARIN SODIUM 40 MG/0.4ML ~~LOC~~ SOLN: 40.0000 mg | Freq: Every day | SUBCUTANEOUS | Status: DC

## 2018-01-15 MED ORDER — INSULIN DETEMIR 100 UNIT/ML ~~LOC~~ SOLN: 13.0000 [IU] | Freq: Every day | SUBCUTANEOUS | Status: DC

## 2018-01-15 MED ORDER — INSULIN DETEMIR 100 UNIT/ML ~~LOC~~ SOLN: 16.0000 [IU] | Freq: Every day | SUBCUTANEOUS | Status: DC

## 2018-01-15 MED ORDER — METOCLOPRAMIDE HCL 5 MG/ML IJ SOLN
10.0000 mg | Freq: Once | INTRAMUSCULAR | Status: AC
  Administered 2018-01-15: 10 mg via INTRAVENOUS
  Filled 2018-01-15: qty 2

## 2018-01-15 MED ORDER — PANTOPRAZOLE SODIUM 40 MG PO TBEC
40.0000 mg | DELAYED_RELEASE_TABLET | Freq: Every day | ORAL | Status: DC
  Administered 2018-01-16 – 2018-01-17 (×2): 40 mg via ORAL
  Filled 2018-01-15 (×2): qty 1

## 2018-01-15 MED ORDER — ONDANSETRON HCL 4 MG PO TABS: 4.0000 mg | ORAL_TABLET | Freq: Four times a day (QID) | ORAL | Status: DC | PRN

## 2018-01-15 MED ORDER — NICOTINE 14 MG/24HR TD PT24
14.0000 mg | MEDICATED_PATCH | Freq: Every day | TRANSDERMAL | Status: DC
  Administered 2018-01-15 – 2018-01-17 (×3): 14 mg via TRANSDERMAL
  Filled 2018-01-15 (×3): qty 1

## 2018-01-15 MED ORDER — ONDANSETRON HCL 4 MG/2ML IJ SOLN
4.0000 mg | Freq: Four times a day (QID) | INTRAMUSCULAR | Status: DC | PRN
  Administered 2018-01-16 – 2018-01-17 (×3): 4 mg via INTRAVENOUS
  Filled 2018-01-15 (×3): qty 2

## 2018-01-15 MED ORDER — INSULIN ASPART 100 UNIT/ML ~~LOC~~ SOLN
0.0000 [IU] | SUBCUTANEOUS | Status: DC
  Administered 2018-01-15: 7 [IU] via SUBCUTANEOUS
  Administered 2018-01-16: 2 [IU] via SUBCUTANEOUS
  Administered 2018-01-16: 9 [IU] via SUBCUTANEOUS
  Administered 2018-01-16: 1 [IU] via SUBCUTANEOUS
  Administered 2018-01-16 – 2018-01-17 (×2): 9 [IU] via SUBCUTANEOUS

## 2018-01-15 MED ORDER — ACETAMINOPHEN 325 MG PO TABS
650.0000 mg | ORAL_TABLET | Freq: Four times a day (QID) | ORAL | Status: DC | PRN
  Administered 2018-01-16 – 2018-01-17 (×2): 650 mg via ORAL
  Filled 2018-01-15 (×2): qty 2

## 2018-01-15 MED ORDER — FAMOTIDINE IN NACL 20-0.9 MG/50ML-% IV SOLN
20.0000 mg | Freq: Once | INTRAVENOUS | Status: AC
  Administered 2018-01-15: 20 mg via INTRAVENOUS
  Filled 2018-01-15: qty 50

## 2018-01-15 MED ORDER — SODIUM CHLORIDE 0.9 % IV BOLUS
1000.0000 mL | Freq: Once | INTRAVENOUS | Status: AC
  Administered 2018-01-15: 1000 mL via INTRAVENOUS

## 2018-01-15 MED ORDER — INSULIN ASPART 100 UNIT/ML ~~LOC~~ SOLN: 0.0000 [IU] | SUBCUTANEOUS | Status: DC

## 2018-01-15 MED ORDER — HYDROMORPHONE HCL 1 MG/ML IJ SOLN
0.5000 mg | INTRAMUSCULAR | Status: DC | PRN
  Administered 2018-01-16 – 2018-01-17 (×8): 1 mg via INTRAVENOUS
  Filled 2018-01-15 (×8): qty 1

## 2018-01-15 NOTE — H&P (Signed)
History and Physical    Trishna Cwik WLN:[id number] DOB: [birth year] DOA: 01/15/2018  PCP: Patient, No Pcp Per  Patient coming from: Home  I have personally briefly reviewed patient's old medical records in [facility]  Chief Complaint: Pancreatitis  HPI: Heather Hunt is a 37 y.o. female with medical history significant of recurrent pancreatitis for past 4-5 years, IDDM, remote h/o EtOH abuse, chronic abd pain.  Patient presents to the ED for persistent, severe, abd pain.  Epigastric.  Ongoing for past 5-6 days.  Offered admission for pain control x2 days ago but declined.  Feels like prior pancreatitis pain and CT x2 days ago showed acute on chronic pancreatitis with areas of necrosis.  Also having CBG elevation despite taking 26u of degludic this AM and usual insulin during the day.   ED Course: BGL 555, down to 390 after 8u novolog.  Lipase 136.   Review of Systems: As per HPI otherwise 10 point review of systems negative.   Past Medical History:  Diagnosis Date  . Diabetes mellitus without complication ([place])   . Pancreatitis     Past Surgical History:  Procedure Laterality Date  . BILE DUCT STENT PLACEMENT    . KNEE SURGERY    . pancreas stent    . SYMPATHECTOMY       reports that she has been smoking cigarettes. She has been smoking about 0.50 packs per day. She has never used smokeless tobacco. She reports that she drank alcohol. She reports that she does not use drugs.  Allergies  Allergen Reactions  . Morphine And Related Hives    History reviewed. No pertinent family history.   Prior to Admission medications   Medication Sig Start Date End Date Taking? Authorizing Provider  blood glucose meter kit and supplies KIT Dispense based on patient and insurance preference. Use up to four times daily as directed. (FOR ICD-9 250.00, 250.01). 12/07/17  Yes Waynetta Pean, PA-C  escitalopram (LEXAPRO) 10 MG tablet Take 10 mg by mouth daily.    Yes [provider]  fentaNYL (DURAGESIC - DOSED MCG/HR) 12 MCG/HR Place 12.5 mcg onto the skin every 3 (three) days.    Yes [provider]  fentaNYL (DURAGESIC - DOSED MCG/HR) 25 MCG/HR patch Place 25 mcg onto the skin every 3 (three) days.    Yes [provider]  HYDROcodone-acetaminophen (NORCO/VICODIN) 5-325 MG tablet Take 1 tablet by mouth every 6 (six) hours as needed for moderate pain. 01/13/18  Yes Milton Ferguson, MD  insulin degludec (TRESIBA FLEXTOUCH) 100 UNIT/ML SOPN FlexTouch Pen Inject 26 Units into the skin daily. 08/03/17  Yes [provider]  insulin regular (HUMULIN R) 100 units/mL injection Inject 1-10 Units into the skin 3 (three) times daily before meals. Sliding scale  03/04/17  Yes [provider]  lipase/protease/amylase (CREON) 12000 units CPEP capsule Take 2 capsules by mouth 3 (three) times daily.   Yes [provider]  ondansetron (ZOFRAN ODT) 4 MG disintegrating tablet '4mg'$  ODT q4 hours prn nausea/vomit 01/13/18  Yes Milton Ferguson, MD  pantoprazole (PROTONIX) 40 MG tablet Take 40 mg by mouth daily. 10/12/17  Yes [provider]  potassium chloride (K-DUR) 10 MEQ tablet Take 1 tablet (10 mEq total) by mouth daily. 01/13/18  Yes Milton Ferguson, MD  pregabalin (LYRICA) 50 MG capsule Take 50 mg by mouth 3 (three) times daily.   Yes [provider]  promethazine (PHENERGAN) 12.5 MG tablet Take 12.5 mg by mouth every 6 (six) hours  as needed for nausea or vomiting.  11/11/17  Yes [provider]    Physical Exam: Vitals:   01/15/18 1930 01/15/18 2000 01/15/18 2030 01/15/18 2051  BP: 115/75 109/77 109/76 109/76  Pulse: 77 74 83 78  Resp:    16  Temp:      TempSrc:      SpO2: 95% 95% 94% 95%  Weight:      Height:        Constitutional: NAD, calm, comfortable Eyes: PERRL, lids and conjunctivae normal ENMT: Mucous membranes are moist. Posterior pharynx clear of any exudate or lesions.Normal dentition.  Neck:  normal, supple, no masses, no thyromegaly Respiratory: clear to auscultation bilaterally, no wheezing, no crackles. Normal respiratory effort. No accessory muscle use.  Cardiovascular: Regular rate and rhythm, no murmurs / rubs / gallops. No extremity edema. 2+ pedal pulses. No carotid bruits.  Abdomen: no tenderness, no masses palpated. No hepatosplenomegaly. Bowel sounds positive.  Musculoskeletal: no clubbing / cyanosis. No joint deformity upper and lower extremities. Good ROM, no contractures. Normal muscle tone.  Skin: no rashes, lesions, ulcers. No induration Neurologic: CN 2-12 grossly intact. Sensation intact, DTR normal. Strength 5/5 in all 4.  Psychiatric: Normal judgment and insight. Alert and oriented x 3. Normal mood.    Labs on Admission: I have personally reviewed following labs and imaging studies  CBC: Recent Labs  Lab 01/13/18 1934 01/15/18 1602  WBC 9.4 8.1  NEUTROABS 4.8  --   HGB 13.5 12.9  HCT 40.1 39.1  MCV 87.6 89.5  PLT 187 916*   Basic Metabolic Panel: Recent Labs  Lab 01/13/18 1934 01/15/18 1602  NA 136 135  K 2.8* 3.8  CL 98 98  CO2 27 26  GLUCOSE 400* 555*  BUN 8 6  CREATININE 0.79 0.62  CALCIUM 8.9 8.6*   GFR: Estimated Creatinine Clearance: 87.9 mL/min (by C-G formula based on SCr of 0.62 mg/dL). Liver Function Tests: Recent Labs  Lab 01/13/18 1934 01/15/18 1602  AST 20 25  ALT 21 23  ALKPHOS 194* 183*  BILITOT 0.4 0.7  PROT 7.0 6.6  ALBUMIN 3.7 3.5   Recent Labs  Lab 01/13/18 1934 01/15/18 1602  LIPASE 94* 136*   No results for input(s): AMMONIA in the last 168 hours. Coagulation Profile: No results for input(s): INR, PROTIME in the last 168 hours. Cardiac Enzymes: No results for input(s): CKTOTAL, CKMB, CKMBINDEX, TROPONINI in the last 168 hours. BNP (last 3 results) No results for input(s): PROBNP in the last 8760 hours. HbA1C: No results for input(s): HGBA1C in the last 72 hours. CBG: Recent Labs  Lab  01/13/18 1738 01/13/18 2229 01/15/18 1711 01/15/18 1932 01/15/18 2056  GLUCAP 498* 329* 539* 390* 421*   Lipid Profile: No results for input(s): CHOL, HDL, LDLCALC, TRIG, CHOLHDL, LDLDIRECT in the last 72 hours. Thyroid Function Tests: No results for input(s): TSH, T4TOTAL, FREET4, T3FREE, THYROIDAB in the last 72 hours. Anemia Panel: No results for input(s): VITAMINB12, FOLATE, FERRITIN, TIBC, IRON, RETICCTPCT in the last 72 hours. Urine analysis:    Component Value Date/Time   COLORURINE YELLOW 01/15/2018 1602   APPEARANCEUR CLEAR 01/15/2018 1602   LABSPEC 1.033 (H) 01/15/2018 1602   PHURINE 7.0 01/15/2018 1602   GLUCOSEU >=500 (A) 01/15/2018 1602   HGBUR NEGATIVE 01/15/2018 1602   BILIRUBINUR NEGATIVE 01/15/2018 1602   KETONESUR 20 (A) 01/15/2018 1602   PROTEINUR NEGATIVE 01/15/2018 1602   NITRITE NEGATIVE 01/15/2018 1602   LEUKOCYTESUR NEGATIVE 01/15/2018 1602  Radiological Exams on Admission: US Abdomen Complete  Result Date: 01/15/2018 CLINICAL DATA:  Right upper quadrant abdominal pain. Acute on chronic pancreatitis on a recent abdomen and pelvis CT. There were multiple small loculated fluid collections along the course of the body and head of the pancreas on the CT with pancreatic ductal dilatation and a pancreatic stent in place. There is also a biliary stent with biliary ductal dilatation status post cholecystectomy. EXAM: ABDOMEN ULTRASOUND COMPLETE COMPARISON:  Abdomen and pelvis CT dated 01/13/2018. FINDINGS: Gallbladder: Surgically absent. Common bile duct: Diameter: 6.0 mm Liver: Intrahepatic biliary ductal dilatation, unchanged. Normal echotexture. Portal vein is patent on color Doppler imaging with normal direction of blood flow towards the liver. IVC: No abnormality visualized. Pancreas: Obscured by overlying bowel gas. Spleen: Size and appearance within normal limits. Right Kidney: Length: 11.4 cm. Echogenicity within normal limits. No mass or hydronephrosis  visualized. Left Kidney: Length: 11.3 cm. Echogenicity within normal limits. No mass or hydronephrosis visualized. Abdominal aorta: No aneurysm visualized. Other findings: None. IMPRESSION: 1. No acute abnormality. 2. Stable mild biliary ductal dilatation, status post cholecystectomy. Electronically Signed   By: Claudie Revering M.D.   On: 01/15/2018 20:04    EKG: Independently reviewed.  Assessment/Plan Principal Problem:   Acute on chronic pancreatitis (HCC) Active Problems:   IDDM (insulin dependent diabetes mellitus) ([place])    1. Acute on chronic pancreatitis - 1. Continue home Fentanyl patch 2. Hold home norco 3. IV dilaudid PRN 4. zofran PRN nausea 5. IVF: NS at 125 6. NPO except ice chips, sips with meds 2. IDDM - 1. Sensitive SSI q4h 2. Lantus 20u daily (takes degludec 26 in AM usually)  DVT prophylaxis: Lovenox Code Status: Full Family Communication: No family in room Disposition Plan: Home after admit Consults called: None Admission status: Place in obs   Adynn Caseres, [facility] Pager [phone] Only works nights!  If 7AM-7PM, please contact the primary day team physician taking care of patient  [email or web address] Password [facility]  01/15/2018, 9:45 PM

## 2018-01-15 NOTE — ED Provider Notes (Signed)
[facility] DEPT Provider Note   CSN: [id number] Arrival date & time: 01/15/18  1542     History   Chief Complaint Chief Complaint  Patient presents with  . Abdominal Pain  . Hyperglycemia    HPI Heather Hunt is a 37 y.o. female with h/o diabetes on insulin, chronic pancreatitis, remove h/o ETOH abuse, chronic abdominal pain s/p bilateral thoracoscopic sympathectomy for pain control in 05/2017 in [place], chronic oxycodone and fentanyl patch use is here for evaluation of sudden onset, gradually worsening, persistent, severe epigastric abdominal pain.  Onset 4 days ago.  Pain intermittently radiates to right upper quadrant and left upper quadrant.  States pain is similar to flares of pancreatitis.  Associated with nausea, nonbilious nonbloody emesis, diarrhea, generalized myalgias.  Has been taking her chronic pain medications and Zofran without relief.  Was seen in the ER 2 days ago and was told she had a flare of pancreatitis and recommended admission however she declined.  She was hoping she could manage symptoms at home but returns because they are not improving.  She denies associated fevers, chest pain, shortness of breath, hematemesis, blood in stool, dysuria.  Additionally, states her CBG has been persistently elevated.  Last CBG 458 2 hours prior to arrival.  She is been compliant with her regular insulin regimen.  Reports dry mouth and polyuria.  No history of DKA.  HPI  Past Medical History:  Diagnosis Date  . Diabetes mellitus without complication ([place])   . Pancreatitis     There are no active problems to display for this patient.   Past Surgical History:  Procedure Laterality Date  . BILE DUCT STENT PLACEMENT    . KNEE SURGERY    . pancreas stent       OB History   None      Home Medications    Prior to Admission medications   Medication Sig Start Date End Date Taking? Authorizing Provider  blood glucose meter kit and  supplies KIT Dispense based on patient and insurance preference. Use up to four times daily as directed. (FOR ICD-9 250.00, 250.01). 12/07/17  Yes Waynetta Pean, PA-C  escitalopram (LEXAPRO) 10 MG tablet Take 10 mg by mouth daily.    Yes [provider]  fentaNYL (DURAGESIC - DOSED MCG/HR) 12 MCG/HR Place 12.5 mcg onto the skin every 3 (three) days.    Yes [provider]  fentaNYL (DURAGESIC - DOSED MCG/HR) 25 MCG/HR patch Place 25 mcg onto the skin every 3 (three) days.    Yes [provider]  HYDROcodone-acetaminophen (NORCO/VICODIN) 5-325 MG tablet Take 1 tablet by mouth every 6 (six) hours as needed for moderate pain. 01/13/18  Yes Milton Ferguson, MD  insulin degludec (TRESIBA FLEXTOUCH) 100 UNIT/ML SOPN FlexTouch Pen Inject 26 Units into the skin daily. 08/03/17  Yes [provider]  insulin regular (HUMULIN R) 100 units/mL injection Inject 1-10 Units into the skin 3 (three) times daily before meals. Sliding scale  03/04/17  Yes [provider]  lipase/protease/amylase (CREON) 12000 units CPEP capsule Take 2 capsules by mouth 3 (three) times daily.   Yes [provider]  ondansetron (ZOFRAN ODT) 4 MG disintegrating tablet '4mg'$  ODT q4 hours prn nausea/vomit 01/13/18  Yes Milton Ferguson, MD  pantoprazole (PROTONIX) 40 MG tablet Take 40 mg by mouth daily. 10/12/17  Yes [provider]  potassium chloride (K-DUR) 10 MEQ tablet Take 1 tablet (10 mEq total) by mouth daily. 01/13/18  Yes Milton Ferguson, MD  pregabalin (LYRICA) 50 MG capsule Take 50 mg by mouth 3 (three) times daily.   Yes [provider]  promethazine (PHENERGAN) 12.5 MG tablet Take 12.5 mg by mouth every 6 (six) hours as needed for nausea or vomiting.  11/11/17  Yes [provider]    Family History History reviewed. No pertinent family history.  Social History Social History   Tobacco Use  . Smoking status: Current Every Day Smoker    Packs/day: 0.50     Types: Cigarettes  . Smokeless tobacco: Never Used  Substance Use Topics  . Alcohol use: Not Currently  . Drug use: Never     Allergies   Morphine and related   Review of Systems Review of Systems  Gastrointestinal: Positive for abdominal pain, diarrhea, nausea and vomiting.  Allergic/Immunologic: Positive for immunocompromised state (DM).  All other systems reviewed and are negative.    Physical Exam Updated Vital Signs BP 109/76 (BP Location: Right Arm)   Pulse 78   Temp 98.4 F (36.9 C) (Oral)   Resp 16   Ht '5\' 6"'$  (1.676 m)   Wt 56.7 kg   SpO2 95%   BMI 20.18 kg/m   Physical Exam  Constitutional: She is oriented to person, place, and time. She appears well-developed and well-nourished.  Looks uncomfortable but nontoxic.  Sitting in bed in fetal position.  HENT:  Head: Normocephalic and atraumatic.  Nose: Nose normal.  Moist mucous membranes.  Eyes: Pupils are equal, round, and reactive to light. Conjunctivae and EOM are normal.  Neck: Normal range of motion.  Cardiovascular: Normal rate and regular rhythm.  Pulmonary/Chest: Effort normal and breath sounds normal.  Abdominal: Soft. Bowel sounds are normal. There is tenderness in the right upper quadrant, epigastric area and left upper quadrant. There is guarding.  Diffuse upper abdominal tenderness, worse in epigastric region with mild guarding.  No rigidity.  Negative Murphy's and McBurney's.  Status post cholecystectomy.  No suprapubic or CVA tenderness.  Active bowel sounds to lower quadrants.  Musculoskeletal: Normal range of motion.  Neurological: She is alert and oriented to person, place, and time.  Skin: Skin is warm and dry. Capillary refill takes less than 2 seconds.  Psychiatric: She has a normal mood and affect. Her behavior is normal.  Nursing note and vitals reviewed.    ED Treatments / Results  Labs (all labs ordered are listed, but only abnormal results are displayed) Labs Reviewed  LIPASE,  BLOOD - Abnormal; Notable for the following components:      Result Value   Lipase 136 (*)    All other components within normal limits  COMPREHENSIVE METABOLIC PANEL - Abnormal; Notable for the following components:   Glucose, Bld 555 (*)    Calcium 8.6 (*)    Alkaline Phosphatase 183 (*)    All other components within normal limits  CBC - Abnormal; Notable for the following components:   Platelets 147 (*)    All other components within normal limits  URINALYSIS, ROUTINE W REFLEX MICROSCOPIC - Abnormal; Notable for the following components:   Specific Gravity, Urine 1.033 (*)    Glucose, UA >=500 (*)    Ketones, ur 20 (*)    Bacteria, UA RARE (*)    All other components within normal limits  CBG MONITORING, ED - Abnormal; Notable for the following components:   Glucose-Capillary 539 (*)    All other components within normal limits  CBG MONITORING, ED - Abnormal; Notable for the following components:  Glucose-Capillary 390 (*)    All other components within normal limits  CBG MONITORING, ED - Abnormal; Notable for the following components:   Glucose-Capillary 421 (*)    All other components within normal limits  URINE CULTURE  I-STAT BETA HCG BLOOD, ED ([facility], [facility], [facility] ONLY)    EKG None  Radiology US Abdomen Complete  Result Date: 01/15/2018 CLINICAL DATA:  Right upper quadrant abdominal pain. Acute on chronic pancreatitis on a recent abdomen and pelvis CT. There were multiple small loculated fluid collections along the course of the body and head of the pancreas on the CT with pancreatic ductal dilatation and a pancreatic stent in place. There is also a biliary stent with biliary ductal dilatation status post cholecystectomy. EXAM: ABDOMEN ULTRASOUND COMPLETE COMPARISON:  Abdomen and pelvis CT dated 01/13/2018. FINDINGS: Gallbladder: Surgically absent. Common bile duct: Diameter: 6.0 mm Liver: Intrahepatic biliary ductal dilatation, unchanged. Normal echotexture. Portal vein is  patent on color Doppler imaging with normal direction of blood flow towards the liver. IVC: No abnormality visualized. Pancreas: Obscured by overlying bowel gas. Spleen: Size and appearance within normal limits. Right Kidney: Length: 11.4 cm. Echogenicity within normal limits. No mass or hydronephrosis visualized. Left Kidney: Length: 11.3 cm. Echogenicity within normal limits. No mass or hydronephrosis visualized. Abdominal aorta: No aneurysm visualized. Other findings: None. IMPRESSION: 1. No acute abnormality. 2. Stable mild biliary ductal dilatation, status post cholecystectomy. Electronically Signed   By: Claudie Revering M.D.   On: 01/15/2018 20:04    Procedures Procedures (including critical care time)  Medications Ordered in ED Medications  sodium chloride 0.9 % bolus 1,000 mL (1,000 mLs Intravenous New Bag/Given 01/15/18 2116)  insulin aspart (novoLOG) injection 0-9 Units (has no administration in time range)  0.9 %  sodium chloride infusion (has no administration in time range)  pantoprazole (PROTONIX) EC tablet 40 mg (has no administration in time range)  pregabalin (LYRICA) capsule 50 mg (has no administration in time range)  escitalopram (LEXAPRO) tablet 10 mg (has no administration in time range)  fentaNYL (DURAGESIC - dosed mcg/hr) patch 25 mcg (has no administration in time range)  insulin detemir (LEVEMIR) injection 16 Units (has no administration in time range)  fentaNYL (DURAGESIC - dosed mcg/hr) 12.5 mcg (has no administration in time range)  metoCLOPramide (REGLAN) injection 10 mg (10 mg Intravenous Given 01/15/18 1714)  HYDROmorphone (DILAUDID) injection 1 mg (1 mg Intravenous Given 01/15/18 1713)  famotidine (PEPCID) IVPB 20 mg premix ( Intravenous Stopped 01/15/18 1814)  sodium chloride 0.9 % bolus 1,000 mL (0 mLs Intravenous Stopped 01/15/18 1849)  HYDROmorphone (DILAUDID) injection 1 mg (1 mg Intravenous Given 01/15/18 1858)  insulin aspart (novoLOG) injection 8 Units (8 Units  Subcutaneous Given 01/15/18 1932)  metoCLOPramide (REGLAN) injection 10 mg (10 mg Intravenous Given 01/15/18 2115)  HYDROmorphone (DILAUDID) injection 1 mg (1 mg Intravenous Given 01/15/18 2115)     Initial Impression / Assessment and Plan / ED Course  I have reviewed the triage vital signs and the nursing notes.  Pertinent labs & imaging results that were available during my care of the patient were reviewed by me and considered in my medical decision making (see chart for details).  Clinical Course as of Jan 15 2130  Sat Jan 15, 2018  1720 Glucose-Capillary(!!): 539 [CG]  1849 Glucose(!!): 555 [CG]  1849 Alkaline Phosphatase(!): 183 [CG]  1849 Glucose, UA(!): >=500 [CG]  1849 Ketones, ur(!): 20 [CG]  1849 Bacteria, UA(!): RARE [CG]  1849 Lipase(!): 136 [CG]  2058  Glucose-Capillary(!): 421 [CG]    Clinical Course User Index [CG] Kinnie Feil, PA-C    Concern for acute on chronic pancreatitis versus PUD/GERD versus pancreatic abscess.  I reviewed patient's CT scan 2 days ago and it showed acute on chronic pancreatitis w/ multiple small loculated fluid collections, pancreatic duct dilation with stent, and pancreatic necrosis.  She is afebrile today with focal tenderness and guarding.  Symptoms refractory to liquid diet, oral pain and antiemetic meds at home.  Considering repeat CTAP to r/o abscess or other complication from pancreatitis. She is high risk.  Will attempt to manage symptoms, otherwise considering admission. Pending labs, UA, CBGs to determine extent of hyperglycemia.     1855: Lipase 136 from 94 two days ago.  Hyperglycemia without AG and only ketones in urine.  Chronically elevated alk phos 183.  PLT 147K. Pt with persistent pain, will re-dose dilaudid.  Insulin and IVF running for hyperglycemia.  Will obtain abd [place]. Consider admission for refractory n/v abdominal pain.   2100: [place] non contributory.  CBG 539> 421.  Nausea, vomiting, abdominal pain refractory to dilaudid,  reglan, pepcid.  Will discuss with hospitalist for admission for intractable nausea and vomiting secondary to acute on chronic pancreatitis.   2130: Dr Fabio Neighbors accepted patient.  Final Clinical Impressions(s) / ED Diagnoses   Final diagnoses:  Hyperglycemia  Acute on chronic pancreatitis (HCC)  Intractable cyclical vomiting with nausea  Chronic pain syndrome    ED Discharge Orders    None       Arlean Hopping 01/15/18 2131    Drenda Freeze, MD 01/15/18 2350

## 2018-01-15 NOTE — ED Notes (Signed)
ED TO INPATIENT HANDOFF REPORT  Name/Age/Gender Heather Hunt 37 y.o. female  Code Status    Code Status Orders  (From admission, onward)         Start     Ordered   01/15/18 2135  Full code  Continuous     01/15/18 2135        Code Status History    This patient has a current code status but no historical code status.      Home/SNF/Other Home  Chief Complaint Abdominal Pain / Elevate Blood Sugar Levels   Level of Care/Admitting Diagnosis ED Disposition    ED Disposition Condition Comment   Admit  Hospital Area: [facility] [[id number]]  Level of Care: Med-Surg [16]  Diagnosis: Acute on chronic pancreatitis [facility]) [[id number]]  Admitting Physician: Etta Quill [phone]  Attending Physician: Etta Quill [[id number]]  PT Class (Do Not Modify): Observation [104]  PT Acc Code (Do Not Modify): Observation [10022]       Medical History Past Medical History:  Diagnosis Date  . Diabetes mellitus without complication ([place])   . Pancreatitis     Allergies Allergies  Allergen Reactions  . Morphine And Related Hives    IV Location/Drains/Wounds Patient Lines/Drains/Airways Status   Active Line/Drains/Airways    Name:   Placement date:   Placement time:   Site:   Days:   Peripheral IV 01/15/18 Left Hand   01/15/18    1715    Hand   less than 1          Labs/Imaging Results for orders placed or performed during the hospital encounter of 01/15/18 (from the past 48 hour(s))  Lipase, blood     Status: Abnormal   Collection Time: 01/15/18  4:02 PM  Result Value Ref Range   Lipase 136 (H) 11 - 51 U/L    Comment: Performed at [facility], [place] [address] [place], [place] [address]  Comprehensive metabolic panel     Status: Abnormal   Collection Time: 01/15/18  4:02 PM  Result Value Ref Range   Sodium 135 135 - 145 mmol/L   Potassium 3.8 3.5 - 5.1 mmol/L    Comment: DELTA CHECK NOTED   Chloride 98 98 - 111 mmol/L   CO2 26  22 - 32 mmol/L   Glucose, Bld 555 (HH) 70 - 99 mg/dL    Comment: CRITICAL RESULT CALLED TO, READ BACK BY AND VERIFIED WITH: N.JOESPH,RN [id number] '@1838'$  BY V.WILKINS    BUN 6 6 - 20 mg/dL   Creatinine, Ser 0.62 0.44 - 1.00 mg/dL   Calcium 8.6 (L) 8.9 - 10.3 mg/dL   Total Protein 6.6 6.5 - 8.1 g/dL   Albumin 3.5 3.5 - 5.0 g/dL   AST 25 15 - 41 U/L   ALT 23 0 - 44 U/L   Alkaline Phosphatase 183 (H) 38 - 126 U/L   Total Bilirubin 0.7 0.3 - 1.2 mg/dL   GFR calc non Af Amer >60 >60 mL/min   GFR calc Af Amer >60 >60 mL/min    Comment: (NOTE) The eGFR has been calculated using the CKD EPI equation. This calculation has not been validated in all clinical situations. eGFR's persistently <60 mL/min signify possible Chronic Kidney Disease.    Anion gap 11 5 - 15    Comment: Performed at [facility], [place] [address] [place], [place] [address]  CBC     Status: Abnormal   Collection Time: 01/15/18  4:02  PM  Result Value Ref Range   WBC 8.1 4.0 - 10.5 K/uL   RBC 4.37 3.87 - 5.11 MIL/uL   Hemoglobin 12.9 12.0 - 15.0 g/dL   HCT 39.1 36.0 - 46.0 %   MCV 89.5 78.0 - 100.0 fL   MCH 29.5 26.0 - 34.0 pg   MCHC 33.0 30.0 - 36.0 g/dL   RDW 14.6 11.5 - 15.5 %   Platelets 147 (L) 150 - 400 K/uL    Comment: Performed at [facility], [place] [address], [place], [place] [address]  Urinalysis, Routine w reflex microscopic     Status: Abnormal   Collection Time: 01/15/18  4:02 PM  Result Value Ref Range   Color, Urine YELLOW YELLOW   APPearance CLEAR CLEAR   Specific Gravity, Urine 1.033 (H) 1.005 - 1.030   pH 7.0 5.0 - 8.0   Glucose, UA >=500 (A) NEGATIVE mg/dL   Hgb urine dipstick NEGATIVE NEGATIVE   Bilirubin Urine NEGATIVE NEGATIVE   Ketones, ur 20 (A) NEGATIVE mg/dL   Protein, ur NEGATIVE NEGATIVE mg/dL   Nitrite NEGATIVE NEGATIVE   Leukocytes, UA NEGATIVE NEGATIVE   RBC / HPF 0-5 0 - 5 RBC/hpf   WBC, UA 0-5 0 - 5 WBC/hpf   Bacteria, UA RARE (A) NONE SEEN    Squamous Epithelial / LPF 0-5 0 - 5    Comment: Performed at [facility], [place] [address], [place], [place] [address]  POC CBG, ED     Status: Abnormal   Collection Time: 01/15/18  5:11 PM  Result Value Ref Range   Glucose-Capillary 539 (HH) 70 - 99 mg/dL   Comment 1 Notify RN   I-Stat beta hCG blood, ED     Status: None   Collection Time: 01/15/18  5:23 PM  Result Value Ref Range   I-stat hCG, quantitative <5.0 <5 mIU/mL   Comment 3            Comment:   GEST. AGE      CONC.  (mIU/mL)   <=1 WEEK        5 - 50     2 WEEKS       50 - 500     3 WEEKS       100 - 10,000     4 WEEKS     1,000 - 30,000        FEMALE AND NON-PREGNANT FEMALE:     LESS THAN 5 mIU/mL   CBG monitoring, ED     Status: Abnormal   Collection Time: 01/15/18  7:32 PM  Result Value Ref Range   Glucose-Capillary 390 (H) 70 - 99 mg/dL  CBG monitoring, ED     Status: Abnormal   Collection Time: 01/15/18  8:56 PM  Result Value Ref Range   Glucose-Capillary 421 (H) 70 - 99 mg/dL   US Abdomen Complete  Result Date: 01/15/2018 CLINICAL DATA:  Right upper quadrant abdominal pain. Acute on chronic pancreatitis on a recent abdomen and pelvis CT. There were multiple small loculated fluid collections along the course of the body and head of the pancreas on the CT with pancreatic ductal dilatation and a pancreatic stent in place. There is also a biliary stent with biliary ductal dilatation status post cholecystectomy. EXAM: ABDOMEN ULTRASOUND COMPLETE COMPARISON:  Abdomen and pelvis CT dated 01/13/2018. FINDINGS: Gallbladder: Surgically absent. Common bile duct: Diameter: 6.0 mm Liver: Intrahepatic biliary ductal dilatation, unchanged. Normal echotexture. Portal vein is patent on color  Doppler imaging with normal direction of blood flow towards the liver. IVC: No abnormality visualized. Pancreas: Obscured by overlying bowel gas. Spleen: Size and appearance within normal limits. Right Kidney: Length: 11.4 cm.  Echogenicity within normal limits. No mass or hydronephrosis visualized. Left Kidney: Length: 11.3 cm. Echogenicity within normal limits. No mass or hydronephrosis visualized. Abdominal aorta: No aneurysm visualized. Other findings: None. IMPRESSION: 1. No acute abnormality. 2. Stable mild biliary ductal dilatation, status post cholecystectomy. Electronically Signed   By: Claudie Revering M.D.   On: 01/15/2018 20:04    Pending Labs Unresulted Labs (From admission, onward)    Start     Ordered   01/16/18 0500  Comprehensive metabolic panel  Tomorrow morning,   R     01/15/18 2153   01/15/18 2135  HIV antibody (Routine Testing)  Once,   R     01/15/18 2135   01/15/18 1648  Urine culture  STAT,   STAT     01/15/18 1647          Vitals/Pain Today's Vitals   01/15/18 2030 01/15/18 2034 01/15/18 2051 01/15/18 2115  BP: 109/76  109/76   Pulse: 83  78   Resp:   16   Temp:      TempSrc:      SpO2: 94%  95%   Weight:      Height:      PainSc:  7   7     Isolation Precautions No active isolations  Medications Medications  0.9 %  sodium chloride infusion (has no administration in time range)  pantoprazole (PROTONIX) EC tablet 40 mg (has no administration in time range)  pregabalin (LYRICA) capsule 50 mg (has no administration in time range)  escitalopram (LEXAPRO) tablet 10 mg (has no administration in time range)  fentaNYL (DURAGESIC - dosed mcg/hr) patch 25 mcg (has no administration in time range)  fentaNYL (DURAGESIC - dosed mcg/hr) 12.5 mcg (has no administration in time range)  HYDROmorphone (DILAUDID) injection 0.5-1 mg (has no administration in time range)  acetaminophen (TYLENOL) tablet 650 mg (has no administration in time range)    Or  acetaminophen (TYLENOL) suppository 650 mg (has no administration in time range)  ondansetron (ZOFRAN) tablet 4 mg (has no administration in time range)    Or  ondansetron (ZOFRAN) injection 4 mg (has no administration in time range)   enoxaparin (LOVENOX) injection 40 mg (has no administration in time range)  insulin aspart (novoLOG) injection 0-9 Units (has no administration in time range)  insulin glargine (LANTUS) injection 20 Units (has no administration in time range)  metoCLOPramide (REGLAN) injection 10 mg (10 mg Intravenous Given 01/15/18 1714)  HYDROmorphone (DILAUDID) injection 1 mg (1 mg Intravenous Given 01/15/18 1713)  famotidine (PEPCID) IVPB 20 mg premix ( Intravenous Stopped 01/15/18 1814)  sodium chloride 0.9 % bolus 1,000 mL (0 mLs Intravenous Stopped 01/15/18 1849)  HYDROmorphone (DILAUDID) injection 1 mg (1 mg Intravenous Given 01/15/18 1858)  insulin aspart (novoLOG) injection 8 Units (8 Units Subcutaneous Given 01/15/18 1932)  sodium chloride 0.9 % bolus 1,000 mL (1,000 mLs Intravenous New Bag/Given 01/15/18 2116)  metoCLOPramide (REGLAN) injection 10 mg (10 mg Intravenous Given 01/15/18 2115)  HYDROmorphone (DILAUDID) injection 1 mg (1 mg Intravenous Given 01/15/18 2115)    Mobility walks

## 2018-01-15 NOTE — ED Triage Notes (Signed)
Pt reports hyperglycemia and abdominal pain. She reports a hx of pancreatitis. She states that she declined admission 2 days ago. States that he sugar at home was 458. Endorses N/V.

## 2018-01-16 DIAGNOSIS — E119 Type 2 diabetes mellitus without complications: Secondary | ICD-10-CM

## 2018-01-16 DIAGNOSIS — R739 Hyperglycemia, unspecified: Secondary | ICD-10-CM

## 2018-01-16 DIAGNOSIS — E440 Moderate protein-calorie malnutrition: Secondary | ICD-10-CM | POA: Diagnosis present

## 2018-01-16 DIAGNOSIS — K861 Other chronic pancreatitis: Secondary | ICD-10-CM | POA: Diagnosis present

## 2018-01-16 DIAGNOSIS — K859 Acute pancreatitis without necrosis or infection, unspecified: Principal | ICD-10-CM

## 2018-01-16 DIAGNOSIS — Z794 Long term (current) use of insulin: Secondary | ICD-10-CM

## 2018-01-16 DIAGNOSIS — G894 Chronic pain syndrome: Secondary | ICD-10-CM | POA: Diagnosis present

## 2018-01-16 DIAGNOSIS — Z79899 Other long term (current) drug therapy: Secondary | ICD-10-CM | POA: Diagnosis not present

## 2018-01-16 DIAGNOSIS — F17210 Nicotine dependence, cigarettes, uncomplicated: Secondary | ICD-10-CM | POA: Diagnosis present

## 2018-01-16 DIAGNOSIS — G43A1 Cyclical vomiting, intractable: Secondary | ICD-10-CM

## 2018-01-16 DIAGNOSIS — E876 Hypokalemia: Secondary | ICD-10-CM | POA: Diagnosis present

## 2018-01-16 DIAGNOSIS — K8681 Exocrine pancreatic insufficiency: Secondary | ICD-10-CM | POA: Diagnosis present

## 2018-01-16 DIAGNOSIS — Z6821 Body mass index (BMI) 21.0-21.9, adult: Secondary | ICD-10-CM | POA: Diagnosis not present

## 2018-01-16 DIAGNOSIS — Z885 Allergy status to narcotic agent status: Secondary | ICD-10-CM | POA: Diagnosis not present

## 2018-01-16 DIAGNOSIS — E1065 Type 1 diabetes mellitus with hyperglycemia: Secondary | ICD-10-CM | POA: Diagnosis present

## 2018-01-16 LAB — CBC
HEMATOCRIT: 35.9 % — AB (ref 36.0–46.0)
HEMOGLOBIN: 12.1 g/dL (ref 12.0–15.0)
MCH: 29.9 pg (ref 26.0–34.0)
MCHC: 33.7 g/dL (ref 30.0–36.0)
MCV: 88.6 fL (ref 78.0–100.0)
Platelets: 120 10*3/uL — ABNORMAL LOW (ref 150–400)
RBC: 4.05 MIL/uL (ref 3.87–5.11)
RDW: 14.6 % (ref 11.5–15.5)
WBC: 6.6 10*3/uL (ref 4.0–10.5)

## 2018-01-16 LAB — COMPREHENSIVE METABOLIC PANEL
ALBUMIN: 2.8 g/dL — AB (ref 3.5–5.0)
ALT: 41 U/L (ref 0–44)
AST: 94 U/L — AB (ref 15–41)
Alkaline Phosphatase: 166 U/L — ABNORMAL HIGH (ref 38–126)
Anion gap: 7 (ref 5–15)
BUN: 7 mg/dL (ref 6–20)
CHLORIDE: 106 mmol/L (ref 98–111)
CO2: 28 mmol/L (ref 22–32)
CREATININE: 0.36 mg/dL — AB (ref 0.44–1.00)
Calcium: 8.4 mg/dL — ABNORMAL LOW (ref 8.9–10.3)
GFR calc Af Amer: >60 mL/min (ref >60)
GFR calc non Af Amer: >60 mL/min (ref >60)
GLUCOSE: 187 mg/dL — AB (ref 70–99)
POTASSIUM: 3.2 mmol/L — AB (ref 3.5–5.1)
Sodium: 141 mmol/L (ref 135–145)
Total Bilirubin: 0.3 mg/dL (ref 0.3–1.2)
Total Protein: 5.4 g/dL — ABNORMAL LOW (ref 6.5–8.1)

## 2018-01-16 LAB — GLUCOSE, CAPILLARY
GLUCOSE-CAPILLARY: 373 mg/dL — AB (ref 70–99)
GLUCOSE-CAPILLARY: 431 mg/dL — AB (ref 70–99)
GLUCOSE-CAPILLARY: 323 mg/dL — AB (ref 70–99)
Glucose-Capillary: 198 mg/dL — ABNORMAL HIGH (ref 70–99)
Glucose-Capillary: 150 mg/dL — ABNORMAL HIGH (ref 70–99)
Glucose-Capillary: 172 mg/dL — ABNORMAL HIGH (ref 70–99)
Glucose-Capillary: 130 mg/dL — ABNORMAL HIGH (ref 70–99)

## 2018-01-16 LAB — LACTIC ACID, PLASMA: Lactic Acid, Venous: 0.5 mmol/L (ref 0.5–1.9)

## 2018-01-16 MED ORDER — KETOROLAC TROMETHAMINE 30 MG/ML IJ SOLN
INTRAMUSCULAR | Status: AC
  Administered 2018-01-16: 30 mg
  Filled 2018-01-16: qty 1

## 2018-01-16 MED ORDER — POTASSIUM CHLORIDE 10 MEQ/100ML IV SOLN
10.0000 meq | INTRAVENOUS | Status: AC
  Administered 2018-01-16 (×6): 10 meq via INTRAVENOUS
  Filled 2018-01-16 (×3): qty 100

## 2018-01-16 MED ORDER — MAGNESIUM SULFATE 2 GM/50ML IV SOLN
2.0000 g | Freq: Once | INTRAVENOUS | Status: AC
  Administered 2018-01-16: 2 g via INTRAVENOUS
  Filled 2018-01-16: qty 50

## 2018-01-16 NOTE — Progress Notes (Addendum)
PROGRESS NOTE  Heather Hunt ZOX:[id number] DOB: 07-Oct-1980 DOA: 01/15/2018 PCP: Patient, No Pcp Per  HPI/Recap of past 24 hours: Heather Hunt is a 37 y.o. female with medical history significant of recurrent pancreatitis for past 4-5 years, IDDM, remote h/o EtOH abuse, chronic abd pain. Patient presents to the ED for persistent, severe, abd pain.  Epigastric.  Ongoing for past 5-6 days.  Offered admission for pain control x2 days ago but declined.  CT abd pelvis w contrast x2 days ago showed acute on chronic pancreatitis with areas of necrosis.  Admitted for acute on chronic pancreatitis.  01/16/2018: Patient seen and examined at bedside.  She reports severe epigastric pain.  Last pain medication was given 2 hours ago, IV Dilaudid.  General surgery consulted, after reviewing CT abdomen pelvis with contrast recommended to continue pain management and bowel rest.    Assessment/Plan: Principal Problem:   Acute on chronic pancreatitis (HCC) Active Problems:   IDDM (insulin dependent diabetes mellitus) (HCC)  Acute on chronic pancreatitis Has a stent in place with possible small necrosis Elevated lipase level Repeat lipase level in the morning General surgery consulted and reviewed imaging, recommended to continue pain management and bowel rest Okay to start clear liquid diet as tolerated Continue pain management with IV Dilaudid PRN for severe and breakthrough pain  Continue home fentanyl patch for moderate pain Continue IV fluid hydration Continue to monitor symptoms and vital signs  Type 1 diabetes uncontrolled with hyperglycemia Continue insulin regimen Obtain A1c Diabetes coordinator consult Obtain BMP in the morning  Hypokalemia Repleted with IV KCl 6T milliequivalent Repeat BMP in the morning  Elevated AST Appears transient Repeat CMP in the morning  Moderate protein calorie malnutrition Albumin 2.8 BMI 21 Previously had a PEG tube in place in December 2018 for 1  month due to poor nutrition Encourage increase p.o. protein calorie intake  Risks: High risk for decompensation due to acute on chronic pancreatitis with suspicion for necrosis requiring IV pain medications and close monitoring.  Patient will require at least 2 midnights for bowel rest and pain management.  Code Status: Full code  Family Communication: None at bedside  Disposition Plan: Home possibly 1 to 2 days when clinically stable and when general surgery signs off.   Consultants:  General surgery  Procedures:  None  Antimicrobials:  None  DVT prophylaxis: Subcu Lovenox daily   Objective: Vitals:   01/15/18 2030 01/15/18 2051 01/15/18 2229 01/16/18 0452  BP: 109/76 109/76 98/72 119/83  Pulse: 83 78 77 76  Resp:  16 18 17   Temp:   (!) 97.5 F (36.4 C) 98.2 F (36.8 C)  TempSrc:   Oral Oral  SpO2: 94% 95% 93% 97%  Weight:    60 kg  Height:    5\' 6"  (1.676 m)    Intake/Output Summary (Last 24 hours) at 01/16/2018 1334 Last data filed at 01/16/2018 0402 Gross per 24 hour  Intake 1741.07 ml  Output -  Net 1741.07 ml   Filed Weights   01/15/18 1643 01/16/18 0452  Weight: 56.7 kg 60 kg    Exam:  . General: 37 y.o. year-old female well developed well nourished in no acute distress.  Alert and oriented x3. . Cardiovascular: Regular rate and rhythm with no rubs or gallops.  No thyromegaly or JVD noted.  Marland Kitchen Respiratory: Clear to auscultation with no wheezes or rales. Good inspiratory effort. . Abdomen: Severe pain in palpation of epigastric area.  Normal bowel sounds x4 quadrants. . Musculoskeletal:  No lower extremity edema. 2/4 pulses in all 4 extremities. . Skin: No ulcerative lesions noted or rashes, . Psychiatry: Mood is appropriate for condition and setting   Data Reviewed: CBC: Recent Labs  Lab 01/13/18 1934 01/15/18 1602 01/16/18 0523  WBC 9.4 8.1 6.6  NEUTROABS 4.8  --   --   HGB 13.5 12.9 12.1  HCT 40.1 39.1 35.9*  MCV 87.6 89.5 88.6  PLT  187 147* 120*   Basic Metabolic Panel: Recent Labs  Lab 01/13/18 1934 01/15/18 1602 01/16/18 0523  NA 136 135 141  K 2.8* 3.8 3.2*  CL 98 98 106  CO2 27 26 28   GLUCOSE 400* 555* 187*  BUN 8 6 7   CREATININE 0.79 0.62 0.36*  CALCIUM 8.9 8.6* 8.4*   GFR: Estimated Creatinine Clearance: 91.9 mL/min (A) (by C-G formula based on SCr of 0.36 mg/dL (L)). Liver Function Tests: Recent Labs  Lab 01/13/18 1934 01/15/18 1602 01/16/18 0523  AST 20 25 94*  ALT 21 23 41  ALKPHOS 194* 183* 166*  BILITOT 0.4 0.7 0.3  PROT 7.0 6.6 5.4*  ALBUMIN 3.7 3.5 2.8*   Recent Labs  Lab 01/13/18 1934 01/15/18 1602  LIPASE 94* 136*   No results for input(s): AMMONIA in the last 168 hours. Coagulation Profile: No results for input(s): INR, PROTIME in the last 168 hours. Cardiac Enzymes: No results for input(s): CKTOTAL, CKMB, CKMBINDEX, TROPONINI in the last 168 hours. BNP (last 3 results) No results for input(s): PROBNP in the last 8760 hours. HbA1C: No results for input(s): HGBA1C in the last 72 hours. CBG: Recent Labs  Lab 01/15/18 2056 01/15/18 2231 01/16/18 0455 01/16/18 0817 01/16/18 1203  GLUCAP 421* 345* 198* 150* 172*   Lipid Profile: No results for input(s): CHOL, HDL, LDLCALC, TRIG, CHOLHDL, LDLDIRECT in the last 72 hours. Thyroid Function Tests: No results for input(s): TSH, T4TOTAL, FREET4, T3FREE, THYROIDAB in the last 72 hours. Anemia Panel: No results for input(s): VITAMINB12, FOLATE, FERRITIN, TIBC, IRON, RETICCTPCT in the last 72 hours. Urine analysis:    Component Value Date/Time   COLORURINE YELLOW 01/15/2018 1602   APPEARANCEUR CLEAR 01/15/2018 1602   LABSPEC 1.033 (H) 01/15/2018 1602   PHURINE 7.0 01/15/2018 1602   GLUCOSEU >=500 (A) 01/15/2018 1602   HGBUR NEGATIVE 01/15/2018 1602   BILIRUBINUR NEGATIVE 01/15/2018 1602   KETONESUR 20 (A) 01/15/2018 1602   PROTEINUR NEGATIVE 01/15/2018 1602   NITRITE NEGATIVE 01/15/2018 1602   LEUKOCYTESUR NEGATIVE  01/15/2018 1602   Sepsis Labs: @LABRCNTIP (procalcitonin:4,lacticidven:4)  )No results found for this or any previous visit (from the past 240 hour(s)).    Studies: [place] Abdomen Complete  Result Date: 01/15/2018 CLINICAL DATA:  Right upper quadrant abdominal pain. Acute on chronic pancreatitis on a recent abdomen and pelvis CT. There were multiple small loculated fluid collections along the course of the body and head of the pancreas on the CT with pancreatic ductal dilatation and a pancreatic stent in place. There is also a biliary stent with biliary ductal dilatation status post cholecystectomy. EXAM: ABDOMEN ULTRASOUND COMPLETE COMPARISON:  Abdomen and pelvis CT dated 01/13/2018. FINDINGS: Gallbladder: Surgically absent. Common bile duct: Diameter: 6.0 mm Liver: Intrahepatic biliary ductal dilatation, unchanged. Normal echotexture. Portal vein is patent on color Doppler imaging with normal direction of blood flow towards the liver. IVC: No abnormality visualized. Pancreas: Obscured by overlying bowel gas. Spleen: Size and appearance within normal limits. Right Kidney: Length: 11.4 cm. Echogenicity within normal limits. No mass or hydronephrosis visualized. Left Kidney:  Length: 11.3 cm. Echogenicity within normal limits. No mass or hydronephrosis visualized. Abdominal aorta: No aneurysm visualized. Other findings: None. IMPRESSION: 1. No acute abnormality. 2. Stable mild biliary ductal dilatation, status post cholecystectomy. Electronically Signed   By: Beckie Salts M.D.   On: 01/15/2018 20:04    Scheduled Meds: . enoxaparin (LOVENOX) injection  40 mg Subcutaneous QHS  . escitalopram  10 mg Oral Daily  . fentaNYL  25 mcg Transdermal Q72H  . insulin aspart  0-9 Units Subcutaneous Q4H  . insulin glargine  20 Units Subcutaneous Daily  . nicotine  14 mg Transdermal Daily  . pantoprazole  40 mg Oral Daily  . pregabalin  50 mg Oral TID    Continuous Infusions: . sodium chloride 125 mL/hr at  01/16/18 1016     LOS: 0 days     Darlin Drop, MD [facility] Pager [phone]  If 7PM-7AM, please contact night-coverage [email or web address] Password [facility] 01/16/2018, 1:34 PM

## 2018-01-16 NOTE — Consult Note (Signed)
Reason for Consult: Abdominal pain, pancreatitis  Referring Physician: [name] is an 37 y.o. female.  HPI: I was asked to evaluate this patient.  She is a 37 year old female with a long history of pancreatitis and complications secondary to alcohol abuse.  She states she has been abstinent for 5 years.  She has a history of multiple admissions in [place] and then several hospitals in [place] for acute and chronic pancreatitis.  She has had a previous apparent laparoscopic pseudocyst drainage in [place] about 5 years ago.  Has had a pancreatic duct stent placed by the GI service at [place] earlier this year.  Has had celiac plexus ambulation.  Has been living near [place] but currently moved locally.  Has had multiple admissions this year for abdominal pain without procedures.  She is followed in a chronic pain clinic in [place].  She does have periods where she is relatively pain-free.  Also has long periods of low-grade pain not requiring intervention.  This episode began 5 or 6 days ago.  She describes aching pain across her epigastrium and sharp right upper quadrant pain.  Was seen in the EDP 2 days ago and CT scan obtained as below.  Declined admission at that time but due to persistent pain re-presented yesterday and was admitted.  Denies nausea vomiting or fever or chills.  Past Medical History:  Diagnosis Date  . Diabetes mellitus without complication ([place])   . Pancreatitis     Past Surgical History:  Procedure Laterality Date  . BILE DUCT STENT PLACEMENT    . KNEE SURGERY    . pancreas stent    . SYMPATHECTOMY      History reviewed. No pertinent family history.  Social History:  reports that she has been smoking cigarettes. She has been smoking about 0.50 packs per day. She has never used smokeless tobacco.   Denies alcohol use for the past 5 years  Allergies:  Allergies  Allergen Reactions  . Morphine And Related Hives    Current Facility-Administered  Medications  Medication Dose Route Frequency Provider Last Rate Last Dose  . 0.9 %  sodium chloride infusion   Intravenous Continuous Etta Quill, DO 125 mL/hr at 01/16/18 1016    . acetaminophen (TYLENOL) tablet 650 mg  650 mg Oral Q6H PRN Etta Quill, DO   650 mg at 01/16/18 1019   Or  . acetaminophen (TYLENOL) suppository 650 mg  650 mg Rectal Q6H PRN Etta Quill, DO      . enoxaparin (LOVENOX) injection 40 mg  40 mg Subcutaneous QHS Jennette Kettle M, DO      . escitalopram (LEXAPRO) tablet 10 mg  10 mg Oral Daily Jennette Kettle M, DO   10 mg at 01/16/18 0909  . fentaNYL (DURAGESIC - dosed mcg/hr) patch 25 mcg  25 mcg Transdermal Q72H Etta Quill, DO   25 mcg at 01/15/18 2326  . HYDROmorphone (DILAUDID) injection 0.5-1 mg  0.5-1 mg Intravenous Q4H PRN Etta Quill, DO   1 mg at 01/16/18 0916  . insulin aspart (novoLOG) injection 0-9 Units  0-9 Units Subcutaneous Q4H Etta Quill, DO   2 Units at 01/16/18 0516  . insulin glargine (LANTUS) injection 20 Units  20 Units Subcutaneous Daily Alcario Drought, Jared M, DO      . nicotine (NICODERM CQ - dosed in mg/24 hours) patch 14 mg  14 mg Transdermal Daily Jennette Kettle M, DO   14 mg at 01/16/18 0916  .  ondansetron (ZOFRAN) tablet 4 mg  4 mg Oral Q6H PRN Etta Quill, DO       Or  . ondansetron [facility]) injection 4 mg  4 mg Intravenous Q6H PRN Etta Quill, DO   4 mg at 01/16/18 0515  . pantoprazole (PROTONIX) EC tablet 40 mg  40 mg Oral Daily Jennette Kettle M, DO   40 mg at 01/16/18 0909  . potassium chloride 10 mEq in 100 mL IVPB  10 mEq Intravenous Q1 Hr x 6 Hall, Archie Patten N, DO 100 mL/hr at 01/16/18 1119 10 mEq at 01/16/18 1119  . pregabalin (LYRICA) capsule 50 mg  50 mg Oral TID Etta Quill, DO   50 mg at 01/16/18 1610     Results for orders placed or performed during the hospital encounter of 01/15/18 (from the past 48 hour(s))  Lipase, blood     Status: Abnormal   Collection Time: 01/15/18  4:02 PM   Result Value Ref Range   Lipase 136 (H) 11 - 51 U/L    Comment: Performed at [facility], [place] [address] [place], [place] [address]  Comprehensive metabolic panel     Status: Abnormal   Collection Time: 01/15/18  4:02 PM  Result Value Ref Range   Sodium 135 135 - 145 mmol/L   Potassium 3.8 3.5 - 5.1 mmol/L    Comment: DELTA CHECK NOTED   Chloride 98 98 - 111 mmol/L   CO2 26 22 - 32 mmol/L   Glucose, Bld 555 (HH) 70 - 99 mg/dL    Comment: CRITICAL RESULT CALLED TO, READ BACK BY AND VERIFIED WITH: N.JOESPH,RN [id number] _0  BY V.WILKINS    BUN 6 6 - 20 mg/dL   Creatinine, Ser 0.62 0.44 - 1.00 mg/dL   Calcium 8.6 (L) 8.9 - 10.3 mg/dL   Total Protein 6.6 6.5 - 8.1 g/dL   Albumin 3.5 3.5 - 5.0 g/dL   AST 25 15 - 41 U/L   ALT 23 0 - 44 U/L   Alkaline Phosphatase 183 (H) 38 - 126 U/L   Total Bilirubin 0.7 0.3 - 1.2 mg/dL   GFR calc non Af Amer >60 >60 mL/min   GFR calc Af Amer >60 >60 mL/min    Comment: (NOTE) The eGFR has been calculated using the CKD EPI equation. This calculation has not been validated in all clinical situations. eGFR's persistently <60 mL/min signify possible Chronic Kidney Disease.    Anion gap 11 5 - 15    Comment: Performed at [facility], [place] [address], [place], [place] [address]  CBC     Status: Abnormal   Collection Time: 01/15/18  4:02 PM  Result Value Ref Range   WBC 8.1 4.0 - 10.5 K/uL   RBC 4.37 3.87 - 5.11 MIL/uL   Hemoglobin 12.9 12.0 - 15.0 g/dL   HCT 39.1 36.0 - 46.0 %   MCV 89.5 78.0 - 100.0 fL   MCH 29.5 26.0 - 34.0 pg   MCHC 33.0 30.0 - 36.0 g/dL   RDW 14.6 11.5 - 15.5 %   Platelets 147 (L) 150 - 400 K/uL    Comment: Performed at [facility], [place] [address], [place], [place] [address]  Urinalysis, Routine w reflex microscopic     Status: Abnormal   Collection Time: 01/15/18  4:02 PM  Result Value Ref Range   Color, Urine YELLOW YELLOW   APPearance CLEAR CLEAR   Specific  Gravity, Urine 1.033 (H) 1.005 -  1.030   pH 7.0 5.0 - 8.0   Glucose, UA >=500 (A) NEGATIVE mg/dL   Hgb urine dipstick NEGATIVE NEGATIVE   Bilirubin Urine NEGATIVE NEGATIVE   Ketones, ur 20 (A) NEGATIVE mg/dL   Protein, ur NEGATIVE NEGATIVE mg/dL   Nitrite NEGATIVE NEGATIVE   Leukocytes, UA NEGATIVE NEGATIVE   RBC / HPF 0-5 0 - 5 RBC/hpf   WBC, UA 0-5 0 - 5 WBC/hpf   Bacteria, UA RARE (A) NONE SEEN   Squamous Epithelial / LPF 0-5 0 - 5    Comment: Performed at [facility], [place] [address] [place], [place] [address]  POC CBG, ED     Status: Abnormal   Collection Time: 01/15/18  5:11 PM  Result Value Ref Range   Glucose-Capillary 539 (HH) 70 - 99 mg/dL   Comment 1 Notify RN   I-Stat beta hCG blood, ED     Status: None   Collection Time: 01/15/18  5:23 PM  Result Value Ref Range   I-stat hCG, quantitative <5.0 <5 mIU/mL   Comment 3            Comment:   GEST. AGE      CONC.  (mIU/mL)   <=1 WEEK        5 - 50     2 WEEKS       50 - 500     3 WEEKS       100 - 10,000     4 WEEKS     1,000 - 30,000        FEMALE AND NON-PREGNANT FEMALE:     LESS THAN 5 mIU/mL   CBG monitoring, ED     Status: Abnormal   Collection Time: 01/15/18  7:32 PM  Result Value Ref Range   Glucose-Capillary 390 (H) 70 - 99 mg/dL  CBG monitoring, ED     Status: Abnormal   Collection Time: 01/15/18  8:56 PM  Result Value Ref Range   Glucose-Capillary 421 (H) 70 - 99 mg/dL  Glucose, capillary     Status: Abnormal   Collection Time: 01/15/18 10:31 PM  Result Value Ref Range   Glucose-Capillary 345 (H) 70 - 99 mg/dL  Glucose, capillary     Status: Abnormal   Collection Time: 01/16/18  4:55 AM  Result Value Ref Range   Glucose-Capillary 198 (H) 70 - 99 mg/dL  Comprehensive metabolic panel     Status: Abnormal   Collection Time: 01/16/18  5:23 AM  Result Value Ref Range   Sodium 141 135 - 145 mmol/L   Potassium 3.2 (L) 3.5 - 5.1 mmol/L   Chloride 106 98 - 111 mmol/L   CO2 28 22 - 32  mmol/L   Glucose, Bld 187 (H) 70 - 99 mg/dL   BUN 7 6 - 20 mg/dL   Creatinine, Ser 0.36 (L) 0.44 - 1.00 mg/dL   Calcium 8.4 (L) 8.9 - 10.3 mg/dL   Total Protein 5.4 (L) 6.5 - 8.1 g/dL   Albumin 2.8 (L) 3.5 - 5.0 g/dL   AST 94 (H) 15 - 41 U/L   ALT 41 0 - 44 U/L   Alkaline Phosphatase 166 (H) 38 - 126 U/L   Total Bilirubin 0.3 0.3 - 1.2 mg/dL   GFR calc non Af Amer >60 >60 mL/min   GFR calc Af Amer >60 >60 mL/min    Comment: (NOTE) The eGFR has been calculated using the CKD EPI equation. This calculation has not been validated  in all clinical situations. eGFR's persistently <60 mL/min signify possible Chronic Kidney Disease.    Anion gap 7 5 - 15    Comment: Performed at [facility], [place] [address] [place], [place] [address]  CBC     Status: Abnormal   Collection Time: 01/16/18  5:23 AM  Result Value Ref Range   WBC 6.6 4.0 - 10.5 K/uL   RBC 4.05 3.87 - 5.11 MIL/uL   Hemoglobin 12.1 12.0 - 15.0 g/dL   HCT 35.9 (L) 36.0 - 46.0 %   MCV 88.6 78.0 - 100.0 fL   MCH 29.9 26.0 - 34.0 pg   MCHC 33.7 30.0 - 36.0 g/dL   RDW 14.6 11.5 - 15.5 %   Platelets 120 (L) 150 - 400 K/uL    Comment: Performed at [facility], [place] [address], [place], [place] [address]  Lactic acid, plasma     Status: None   Collection Time: 01/16/18  5:23 AM  Result Value Ref Range   Lactic Acid, Venous 0.5 0.5 - 1.9 mmol/L    Comment: Performed at [facility], [place] [address], [place], [place] [address]  Glucose, capillary     Status: Abnormal   Collection Time: 01/16/18  8:17 AM  Result Value Ref Range   Glucose-Capillary 150 (H) 70 - 99 mg/dL   Comment 1 Notify RN   Glucose, capillary     Status: Abnormal   Collection Time: 01/16/18 12:03 PM  Result Value Ref Range   Glucose-Capillary 172 (H) 70 - 99 mg/dL   Comment 1 Notify RN   CT scan January 13, 2018: IMPRESSION: 1. Changes of acute on chronic pancreatitis with multiple small loculated  fluid collections along the course of the body and head of the pancreas. Pancreatic ductal dilatation with pancreatic stent in place. 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 2. Surgical absence of the gallbladder with mild bile duct dilatation. Bile duct stent in place. 3. Mild retroperitoneal lymphadenopathy, likely reactive. 4. 5.1 cm simple appearing cyst in the left ovary. Due to borderline size, consider ultrasound follow-up in 6-12 weeks.   US Abdomen Complete  Result Date: 01/15/2018 CLINICAL DATA:  Right upper quadrant abdominal pain. Acute on chronic pancreatitis on a recent abdomen and pelvis CT. There were multiple small loculated fluid collections along the course of the body and head of the pancreas on the CT with pancreatic ductal dilatation and a pancreatic stent in place. There is also a biliary stent with biliary ductal dilatation status post cholecystectomy. EXAM: ABDOMEN ULTRASOUND COMPLETE COMPARISON:  Abdomen and pelvis CT dated 01/13/2018. FINDINGS: Gallbladder: Surgically absent. Common bile duct: Diameter: 6.0 mm Liver: Intrahepatic biliary ductal dilatation, unchanged. Normal echotexture. Portal vein is patent on color Doppler imaging with normal direction of blood flow towards the liver. IVC: No abnormality visualized. Pancreas: Obscured by overlying bowel gas. Spleen: Size and appearance within normal limits. Right Kidney: Length: 11.4 cm. Echogenicity within normal limits. No mass or hydronephrosis visualized. Left Kidney: Length: 11.3 cm. Echogenicity within normal limits. No mass or hydronephrosis visualized. Abdominal aorta: No aneurysm visualized. Other findings: None. IMPRESSION: 1. No acute abnormality. 2. Stable mild biliary ductal dilatation, status post cholecystectomy. Electronically Signed   By: Claudie Revering M.D.   On: 01/15/2018 20:04    Review of Systems  Constitutional: Negative for chills and fever.  Respiratory:  Negative.   Cardiovascular: Negative.   Gastrointestinal: Positive for abdominal pain. Negative for blood in stool, nausea  and vomiting.  Psychiatric/Behavioral: Negative for substance abuse.   Blood pressure 119/83, pulse 76, temperature 98.2 F (36.8 C), temperature source Oral, resp. rate 17, height _0  (1.676 m), weight 60 kg, SpO2 97 %. Physical Exam General: Alert, thin pleasant Caucasian female, in no distress Skin: Multiple tattoos.  Warm and dry without rash or infection. HEENT: No palpable masses or thyromegaly. Sclera nonicteric. Pupils equal round and  Lymph nodes: No cervical, supraclavicular, nodes palpable. Lungs: Breath sounds clear and equal without increased work of breathing Cardiovascular: Regular rate and rhythm without murmur. No JVD or edema. Peripheral pulses intact. Abdomen: Nondistended.  Well-healed laparoscopic incisions.  Moderate epigastric and right upper quadrant tenderness with some guarding.  No masses palpable. No organomegaly. No palpable hernias. Extremities: No edema or joint swelling or deformity. No chronic venous stasis changes. Neurologic: Alert and fully oriented.  No gross motor deficits.  Affect normal.  Assessment/Plan: Long history of acute and chronic pancreatitis as above.  I have personally reviewed her CT scan.  This shows some small cystic areas, the largest measuring 2.5 cm.  Stent in place and pancreatic duct that is not dilated.  There are scattered small areas of hypoperfusion.  I do not see any large or significant areas of necrosis.  There is no evidence of infection. There are no indications for any surgical intervention at this point.  I think she is at low risk for needing emergency surgery.  She needs consistent long-term follow-up with GI, possibly at [facility] and pain management.  For now would allow clear liquid diet and treat with pain medications and bowel rest.  Edward Jolly 01/16/2018, 12:20 PM

## 2018-01-17 LAB — CBC
HEMATOCRIT: 38.1 % (ref 36.0–46.0)
HEMOGLOBIN: 12.4 g/dL (ref 12.0–15.0)
MCH: 29.2 pg (ref 26.0–34.0)
MCHC: 32.5 g/dL (ref 30.0–36.0)
MCV: 89.9 fL (ref 78.0–100.0)
Platelets: 123 10*3/uL — ABNORMAL LOW (ref 150–400)
RBC: 4.24 MIL/uL (ref 3.87–5.11)
RDW: 15.1 % (ref 11.5–15.5)
WBC: 6.7 10*3/uL (ref 4.0–10.5)

## 2018-01-17 LAB — HEMOGLOBIN A1C
Hgb A1c MFr Bld: 13.8 % — ABNORMAL HIGH (ref 4.8–5.6)
Mean Plasma Glucose: 349.36 mg/dL

## 2018-01-17 LAB — GLUCOSE, CAPILLARY
GLUCOSE-CAPILLARY: 102 mg/dL — AB (ref 70–99)
GLUCOSE-CAPILLARY: 118 mg/dL — AB (ref 70–99)
Glucose-Capillary: 468 mg/dL — ABNORMAL HIGH (ref 70–99)

## 2018-01-17 LAB — BASIC METABOLIC PANEL
Anion gap: 6 (ref 5–15)
BUN: 8 mg/dL (ref 6–20)
CALCIUM: 8.2 mg/dL — AB (ref 8.9–10.3)
CHLORIDE: 107 mmol/L (ref 98–111)
CO2: 27 mmol/L (ref 22–32)
CREATININE: 0.43 mg/dL — AB (ref 0.44–1.00)
GFR calc non Af Amer: >60 mL/min (ref >60)
GLUCOSE: 136 mg/dL — AB (ref 70–99)
Potassium: 4.3 mmol/L (ref 3.5–5.1)
Sodium: 140 mmol/L (ref 135–145)

## 2018-01-17 LAB — HIV ANTIBODY (ROUTINE TESTING W REFLEX): HIV Screen 4th Generation wRfx: NONREACTIVE

## 2018-01-17 LAB — URINE CULTURE

## 2018-01-17 LAB — LIPASE, BLOOD: Lipase: 74 U/L — ABNORMAL HIGH (ref 11–51)

## 2018-01-17 MED ORDER — SODIUM CHLORIDE 0.9 % IV BOLUS
500.0000 mL | Freq: Once | INTRAVENOUS | Status: AC
  Administered 2018-01-17: 500 mL via INTRAVENOUS

## 2018-01-17 MED ORDER — NICOTINE 14 MG/24HR TD PT24: 14.0000 mg | MEDICATED_PATCH | Freq: Every day | TRANSDERMAL | 0 refills | Status: DC

## 2018-01-17 MED ORDER — INSULIN REGULAR HUMAN 100 UNIT/ML IJ SOLN: 1.0000 [IU] | Freq: Three times a day (TID) | INTRAMUSCULAR | 0 refills | Status: AC

## 2018-01-17 MED ORDER — PANCRELIPASE (LIP-PROT-AMYL) 12000-38000 UNITS PO CPEP: 24000.0000 [IU] | ORAL_CAPSULE | Freq: Three times a day (TID) | ORAL | 0 refills | Status: AC

## 2018-01-17 MED ORDER — INSULIN DEGLUDEC 100 UNIT/ML ~~LOC~~ SOPN: 26.0000 [IU] | PEN_INJECTOR | Freq: Every day | SUBCUTANEOUS | 0 refills | Status: AC

## 2018-01-17 NOTE — Care Management Note (Signed)
Case Management Note  Patient Details  Name: Heather BettersSarah Hunt MRN: [id number] Date of Birth: August 16, 1980  Subjective/Objective:                  Discharged  Action/Plan: No CM needs present at of discharge.  Expected Discharge Date:  01/17/18               Expected Discharge Plan:  Home/Self Care  In-House Referral:     Discharge planning Services  CM Consult  Post Acute Care Choice:    Choice offered to:     DME Arranged:    DME Agency:     HH Arranged:    HH Agency:     Status of Service:  Completed, signed off  If discussed at [employer] Length of Stay Meetings, dates discussed:    Additional Comments:  Golda AcreDavis, Aymar Whitfill Lynn, RN 01/17/2018, 12:44 PM

## 2018-01-17 NOTE — Discharge Summary (Addendum)
Discharge Summary  Heather Hunt ZOX:[id number] DOB: 1980/06/07  PCP: Patient, No Pcp Per  Admit date: 01/15/2018 Discharge date: 01/17/2018  Time spent: 25 minutes  Recommendations for Outpatient Follow-up:  1. Follow-up with your PCP 2. Follow-up with GI 3. Follow-up with pain management clinic 4. Take your medications as prescribed  Discharge Diagnoses:  Active Hospital Problems   Diagnosis Date Noted  . Acute on chronic pancreatitis ([place]) 01/15/2018  . IDDM (insulin dependent diabetes mellitus) ([place]) 01/15/2018    Resolved Hospital Problems  No resolved problems to display.    Discharge Condition: Stable  Diet recommendation: Resume previous diet  Vitals:   01/17/18 0529 01/17/18 0606  BP: 92/66 100/66  Pulse: 75   Resp:    Temp:    SpO2:      History of present illness:  Heather Hunt a 37 y.o.femalewith medical history significant ofrecurrent pancreatitis for past 4-5 years, IDDM, remote h/o EtOH abuse, chronic abd pain. Patient presents to the ED for persistent, severe, abd pain. Epigastric. Ongoing for past 5-6 days. Offered admission for pain control x2 days ago but declined. CT abd pelvis w contrast x2 days ago showed acute on chronic pancreatitis with areas of necrosis.  Admitted for acute on chronic pancreatitis.  01/16/2018: General surgery consulted, after reviewing CT abdomen pelvis with contrast:This shows some small cystic areas, the largest measuring 2.5 cm.  Stent in place and pancreatic duct that is not dilated.  There are scattered small areas of hypoperfusion.  I do not see any large or significant areas of necrosis.  There is no evidence of infection. There are no indications for any surgical intervention at this point.  I think she is at low risk for needing emergency surgery.  She needs consistent long-term follow-up with GI, possibly at [facility] and pain management.    01/17/2018: Patient seen and examined at her bedside.  Has been  tolerated eating a solid diet with no exacerbation of her abdominal pain or nausea.  She would like to go home.  On the day of discharge patient was hemodynamically stable.  She will need to follow-up with PCP, a GI doctor, and her pain clinic.  Hospital Course:  Principal Problem:   Acute on chronic pancreatitis ([name]) Active Problems:   IDDM (insulin dependent diabetes mellitus) ([name])  Acute on chronic pancreatitis Has a stent in place with possible small necrosis Elevated lipase level trending down General surgery consulted Tolerated solid diet well Follow-up with GI outpatient  Type 1 diabetes uncontrolled with hyperglycemia Continue insulin regimen Diabetes coordinator consult  Resolved hypokalemia post repletion  Elevated AST Appears transient Follow-up with PCP  Moderate protein calorie malnutrition Albumin 2.8 BMI 21 Previously had a PEG tube in place in December 2018 for 1 month due to poor nutrition Encourage increase p.o. protein calorie intake    Code Status: Full code    Consultants:  General surgery  Procedures:  None  Antimicrobials:  None  DVT prophylaxis: Subcu Lovenox daily    Discharge Exam: BP 100/66   Pulse 75   Temp 98.2 F (36.8 C) (Oral)   Resp 18   Ht '5\' 6"'$  (1.676 m)   Wt 60 kg   SpO2 95%   BMI 21.35 kg/m  . General: 37 y.o. year-old female well developed well nourished in no acute distress.  Alert and oriented x3. . Cardiovascular: Regular rate and rhythm with no rubs or gallops.  No thyromegaly or JVD noted.  Marland Kitchen Respiratory: Clear to auscultation with no  wheezes or rales. Good inspiratory effort. . Abdomen: Soft nontender nondistended with normal bowel sounds x4 quadrants. . Musculoskeletal: No lower extremity edema. 2/4 pulses in all 4 extremities. . Skin: No ulcerative lesions noted or rashes, . Psychiatry: Mood is appropriate for condition and setting  Discharge Instructions You were cared for by a  hospitalist during your hospital stay. If you have any questions about your discharge medications or the care you received while you were in the hospital after you are discharged, you can call the unit and asked to speak with the hospitalist on call if the hospitalist that took care of you is not available. Once you are discharged, your primary care physician will handle any further medical issues. Please note that NO REFILLS for any discharge medications will be authorized once you are discharged, as it is imperative that you return to your primary care physician (or establish a relationship with a primary care physician if you do not have one) for your aftercare needs so that they can reassess your need for medications and monitor your lab values.   Allergies as of 01/17/2018      Reactions   Morphine And Related Hives      Medication List    TAKE these medications   blood glucose meter kit and supplies Kit Dispense based on patient and insurance preference. Use up to four times daily as directed. (FOR ICD-9 250.00, 250.01).   escitalopram 10 MG tablet Commonly known as:  LEXAPRO Take 10 mg by mouth daily.   fentaNYL 12 MCG/HR Commonly known as:  DURAGESIC - dosed mcg/hr Place 12.5 mcg onto the skin every 3 (three) days. What changed:  Another medication with the same name was removed. Continue taking this medication, and follow the directions you see here.   HYDROcodone-acetaminophen 5-325 MG tablet Commonly known as:  NORCO/VICODIN Take 1 tablet by mouth every 6 (six) hours as needed for moderate pain.   insulin degludec 100 UNIT/ML Sopn FlexTouch Pen Commonly known as:  TRESIBA Inject 0.26 mLs (26 Units total) into the skin daily.   insulin regular 100 units/mL injection Commonly known as:  NOVOLIN R,HUMULIN R Inject 0.01-0.1 mLs (1-10 Units total) into the skin 3 (three) times daily before meals. Sliding scale   lipase/protease/amylase 12000 units Cpep capsule Commonly known  as:  CREON Take 2 capsules (24,000 Units total) by mouth 3 (three) times daily.   nicotine 14 mg/24hr patch Commonly known as:  NICODERM CQ - dosed in mg/24 hours Place 1 patch (14 mg total) onto the skin daily. Start taking on:  01/18/2018   ondansetron 4 MG disintegrating tablet Commonly known as:  ZOFRAN-ODT '4mg'$  ODT q4 hours prn nausea/vomit   pantoprazole 40 MG tablet Commonly known as:  PROTONIX Take 40 mg by mouth daily.   potassium chloride 10 MEQ tablet Commonly known as:  K-DUR Take 1 tablet (10 mEq total) by mouth daily.   pregabalin 50 MG capsule Commonly known as:  LYRICA Take 50 mg by mouth 3 (three) times daily.   promethazine 12.5 MG tablet Commonly known as:  PHENERGAN Take 12.5 mg by mouth every 6 (six) hours as needed for nausea or vomiting.      Allergies  Allergen Reactions  . Morphine And Related Hives   Follow-up Information    [facility]. Call in 1 day(s).   Why:  Please call for a post hospital follow-up appointment. Contact information: [address] [place] [address] [phone]  [facility]. Call in 1 day(s).   Specialty:  Gastroenterology Why:  Please call for a post hospital follow-up appointment. Contact information: [address] [place] [address] [phone]           The results of significant diagnostics from this hospitalization (including imaging, microbiology, ancillary and laboratory) are listed below for reference.    Significant Diagnostic Studies: US Abdomen Complete  Result Date: 01/15/2018 CLINICAL DATA:  Right upper quadrant abdominal pain. Acute on chronic pancreatitis on a recent abdomen and pelvis CT. There were multiple small loculated fluid collections along the course of the body and head of the pancreas on the CT with pancreatic ductal dilatation and a pancreatic stent in place. There is also a biliary stent  with biliary ductal dilatation status post cholecystectomy. EXAM: ABDOMEN ULTRASOUND COMPLETE COMPARISON:  Abdomen and pelvis CT dated 01/13/2018. FINDINGS: Gallbladder: Surgically absent. Common bile duct: Diameter: 6.0 mm Liver: Intrahepatic biliary ductal dilatation, unchanged. Normal echotexture. Portal vein is patent on color Doppler imaging with normal direction of blood flow towards the liver. IVC: No abnormality visualized. Pancreas: Obscured by overlying bowel gas. Spleen: Size and appearance within normal limits. Right Kidney: Length: 11.4 cm. Echogenicity within normal limits. No mass or hydronephrosis visualized. Left Kidney: Length: 11.3 cm. Echogenicity within normal limits. No mass or hydronephrosis visualized. Abdominal aorta: No aneurysm visualized. Other findings: None. IMPRESSION: 1. No acute abnormality. 2. Stable mild biliary ductal dilatation, status post cholecystectomy. Electronically Signed   By: Claudie Revering M.D.   On: 01/15/2018 20:04   Ct Abdomen Pelvis W Contrast  Result Date: 01/13/2018 CLINICAL DATA:  Abdominal pain and hyperglycemia. History of pancreatitis. Elevated blood sugar. EXAM: CT ABDOMEN AND PELVIS WITH CONTRAST TECHNIQUE: Multidetector CT imaging of the abdomen and pelvis was performed using the standard protocol following bolus administration of intravenous contrast. CONTRAST:  118m ISOVUE-300 IOPAMIDOL (ISOVUE-300) INJECTION 61% COMPARISON:  None. FINDINGS: Lower chest: Mild dependent changes in the lung bases. Hepatobiliary: Surgical absence of the gallbladder. There is mild bile duct dilatation with a stent in the distal common bile duct. No focal liver lesions. Pancreas: Diffuse pancreatic edema with decreased parenchymal enhancement throughout the pancreas. Pancreatic ductal dilatation with a pancreatic stent in place. Peripancreatic edema. Multiple small loculated fluid collections along the course of the body and head of the pancreas. Largest is anterior to the  head and measures about 2.3 cm in diameter. Multiple pancreatic calcifications. Changes are consistent with acute on chronic pancreatitis with evidence of parenchymal necrosis and areas of head off necrosis. Spleen: Normal in size without focal abnormality. Adrenals/Urinary Tract: No adrenal gland nodules. Kidneys are symmetrical. Small cyst on the left kidney. Nephrograms are homogeneous. No hydronephrosis or hydroureter. Bladder is mildly distended without wall thickening or filling defect. This could be physiologic or due to urinary retention. Stomach/Bowel: Stomach, small bowel, and colon are not abnormally distended. Gas and stool throughout the colon. No wall thickening or inflammatory changes are appreciated. Appendix is normal. Vascular/Lymphatic: Normal caliber abdominal aorta with minimal calcification. Retroperitoneal lymph nodes are mildly prominent with periaortic nodes measuring up to about 1.3 cm in maximal diameter. This is nonspecific but likely reactive. Reproductive: Uterus is not enlarged. Left ovarian simple appearing cyst measuring 5.1 cm in diameter. Subcentimeter cyst in the vaginal region likely Bartholin cyst. Other: No free air or free fluid in the abdomen. Abdominal wall musculature appears intact. Musculoskeletal: No acute or significant osseous findings. IMPRESSION: 1. Changes of acute on chronic pancreatitis with  multiple small loculated fluid collections along the course of the body and head of the pancreas. Pancreatic ductal dilatation with pancreatic stent in place. 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 2. Surgical absence of the gallbladder with mild bile duct dilatation. Bile duct stent in place. 3. Mild retroperitoneal lymphadenopathy, likely reactive. 4. 5.1 cm simple appearing cyst in the left ovary. Due to borderline size, consider ultrasound follow-up in 6-12 weeks. Electronically Signed   By: Lucienne Capers M.D.   On: 01/13/2018  21:24    Microbiology: Recent Results (from the past 240 hour(s))  Urine culture     Status: Abnormal   Collection Time: 01/15/18  4:48 PM  Result Value Ref Range Status   Specimen Description   Final    URINE, RANDOM Performed at [facility] [address], [place], [place] [address]    Special Requests   Final    NONE Performed at [facility], [place] [address], [place], [place] [address]    Culture (A)  Final    <10,000 COLONIES/mL INSIGNIFICANT GROWTH Performed at [facility] [address], [place], [place] [address]    Report Status 01/17/2018 FINAL  Final     Labs: Basic Metabolic Panel: Recent Labs  Lab 01/13/18 1934 01/15/18 1602 01/16/18 0523 01/17/18 0602  NA 136 135 141 140  K 2.8* 3.8 3.2* 4.3  CL 98 98 106 107  CO2 '27 26 28 27  '$ GLUCOSE 400* 555* 187* 136*  BUN '8 6 7 8  '$ CREATININE 0.79 0.62 0.36* 0.43*  CALCIUM 8.9 8.6* 8.4* 8.2*   Liver Function Tests: Recent Labs  Lab 01/13/18 1934 01/15/18 1602 01/16/18 0523  AST 20 25 94*  ALT 21 23 41  ALKPHOS 194* 183* 166*  BILITOT 0.4 0.7 0.3  PROT 7.0 6.6 5.4*  ALBUMIN 3.7 3.5 2.8*   Recent Labs  Lab 01/13/18 1934 01/15/18 1602 01/17/18 0602  LIPASE 94* 136* 74*   No results for input(s): AMMONIA in the last 168 hours. CBC: Recent Labs  Lab 01/13/18 1934 01/15/18 1602 01/16/18 0523 01/17/18 0602  WBC 9.4 8.1 6.6 6.7  NEUTROABS 4.8  --   --   --   HGB 13.5 12.9 12.1 12.4  HCT 40.1 39.1 35.9* 38.1  MCV 87.6 89.5 88.6 89.9  PLT 187 147* 120* 123*   Cardiac Enzymes: No results for input(s): CKTOTAL, CKMB, CKMBINDEX, TROPONINI in the last 168 hours. BNP: BNP (last 3 results) No results for input(s): BNP in the last 8760 hours.  ProBNP (last 3 results) No results for input(s): PROBNP in the last 8760 hours.  CBG: Recent Labs  Lab 01/16/18 1935 01/16/18 2151 01/16/18 2329 01/17/18 0404 01/17/18 0750  GLUCAP 431* 323* 130* 102* 118*         Signed:  Kayleen Memos, MD [facility] 01/17/2018, 12:17 PM

## 2018-01-17 NOTE — Discharge Instructions (Signed)
Hyperglycemia Hyperglycemia is when the sugar (glucose) level in your blood is too high. It may not cause symptoms. If you do have symptoms, they may include warning signs, such as:  Feeling more thirsty than normal.  Hunger.  Feeling tired.  Needing to pee (urinate) more than normal.  Blurry eyesight (vision). You may get other symptoms as it gets worse, such as:  Dry mouth.  Not being hungry (loss of appetite).  Fruity-smelling breath.  Weakness.  Weight gain or loss that is not planned. Weight loss may be fast.  A tingling or numb feeling in your hands or feet.  Headache.  Skin that does not bounce back quickly when it is lightly pinched and released (poor skin turgor).  Pain in your belly (abdomen).  Cuts or bruises that heal slowly. High blood sugar can happen to people who do or do not have diabetes. High blood sugar can happen slowly or quickly, and it can be an emergency. Follow these instructions at home: General instructions  Take over-the-counter and prescription medicines only as told by your doctor.  Do not use products that contain nicotine or tobacco, such as cigarettes and e-cigarettes. If you need help quitting, ask your doctor.  Limit alcohol intake to no more than 1 drink per day for nonpregnant women and 2 drinks per day for men. One drink equals 12 oz of beer, 5 oz of wine, or 1 oz of hard liquor.  Manage stress. If you need help with this, ask your doctor.  Keep all follow-up visits as told by your doctor. This is important. Eating and drinking  Stay at a healthy weight.  Exercise regularly, as told by your doctor.  Drink enough fluid, especially when you:  Exercise.  Get sick.  Are in hot temperatures.  Eat healthy foods, such as:  Low-fat (lean) proteins.  Complex carbs (complex carbohydrates), such as whole wheat bread or brown rice.  Fresh fruits and vegetables.  Low-fat dairy products.  Healthy fats.  Drink enough  fluid to keep your pee (urine) clear or pale yellow. If you have diabetes:  Make sure you know the symptoms of hyperglycemia.  Follow your diabetes management plan, as told by your doctor. Make sure you:  Take insulin and medicines as told.  Follow your exercise plan.  Follow your meal plan. Eat on time. Do not skip meals.  Check your blood sugar as often as told. Make sure to check before and after exercise. If you exercise longer or in a different way than you normally do, check your blood sugar more often.  Follow your sick day plan whenever you cannot eat or drink normally. Make this plan ahead of time with your doctor.  Share your diabetes management plan with people in your workplace, school, and household.  Check your urine for ketones when you are ill and as told by your doctor.  Carry a card or wear jewelry that says that you have diabetes. Contact a doctor if:  Your blood sugar level is higher than 240 mg/dL (13.3 mmol/L) for 2 days in a row.  You have problems keeping your blood sugar in your target range.  High blood sugar happens often for you. Get help right away if:  You have trouble breathing.  You have a change in how you think, feel, or act (mental status).  You feel sick to your stomach (nauseous), and that feeling does not go away.  You cannot stop throwing up (vomiting). These symptoms may be an   be an emergency. Do not wait to see if the symptoms will go away. Get medical help right away. Call your local emergency services (911 in the [place] Do not drive yourself to the hospital. Summary  Hyperglycemia is when the sugar (glucose) level in your blood is too high.  High blood sugar can happen to people who do or do not have diabetes.  Make sure you drink enough fluids, eat healthy foods, and exercise regularly.  Contact your doctor if you have problems keeping your blood sugar in your target range. This information is not intended to replace advice given  to you by your health care provider. Make sure you discuss any questions you have with your health care provider. Document Released: 02/08/2009 Document Revised: 12/30/2015 Document Reviewed: 12/30/2015 Elsevier Interactive Patient Education  2017 [employer]   Chronic Pancreatitis Chronic pancreatitis is long-lasting inflammation and scarring of the pancreas. The pancreas is a gland that is located behind the stomach. It produces enzymes that help to digest food. The pancreas also releases the hormones glucagon and insulin, which help to regulate blood sugar. Damage to the pancreas may affect digestion, cause pain in the upper abdomen and back, and cause diabetes. Inflammation can also irritate other abdominal organs near the pancreas. At the very beginning, pancreatitis may be sudden (acute). If acute pancreatitis is not caught in time or treated effectively, or if you have several or prolonged episodes of acute pancreatitis, then the condition can turn into chronic pancreatitis. What are the causes? The most common cause of this condition is alcohol abuse. Other causes include:  High levels of triglycerides in the blood (hypertriglyceridemia).  Gallstones or other conditions that can block the tube that drains the pancreas (pancreatic duct).  Pancreatic cancer.  Cystic fibrosis.  Too much calcium in the blood (hypercalcemia), which may be caused by an overactive parathyroid gland (hyperparathyroidism).  Certain medicines.  Injury to the pancreas.  Infection.  Autoimmune pancreatitis. This is when the body's disease-fighting (immune) system attacks the pancreas.  Genes that are passed along from parent to child (inherited).  In some cases, the cause may not be known. What increases the risk? This condition is more likely to develop in:  Men.  People who are 7140-35 years old.  What are the signs or symptoms? Symptoms of this condition may include:  Abdominal pain. Pain  may also be felt in the upper back and may get worse after eating.  Nausea and vomiting.  Fever.  Weight loss.  A change in the color and consistency of bowel movements, such as diarrhea.  How is this diagnosed? This condition is diagnosed based on your symptoms, your medical history, and a physical exam. You may have tests, such as:  Blood tests.  Stool samples.  Biopsy of the pancreas. This is the removal of a small amount of pancreas tissue to be tested in a lab.  Imaging studies, such as: ? X-rays. ? CT scan. ? MRI. ? Ultrasound.  How is this treated? The goal of treatment is to help relieve symptoms and to prevent complications from occurring. Treatment focuses on:  Resting the pancreas. You may need to stop eating and drinking for a few days while in the hospital to give your pancreas time to recover. During this time, you will be given IV fluids to keep you hydrated.  Controlling pain. You may be given pain medicines by mouth (orally) or as injections.  Improving digestion. You may be given: ? Medicines to help  balance your enzymes. ? Vitamin supplements. ? A specific diet to follow. If you are given a diet, you may work with a specialist (dietitian).  Preventing diabetes. You may need insulin injections.  You may have surgery to:  Clear the pancreatic ducts of any blockages, such as gallstones.  Remove any fluid or damaged tissue from the pancreas.  Follow these instructions at home:  Take over-the-counter and prescription medicines only as told by your health care provider. This includes any vitamin supplements.  Do not drive or operate heavy machinery while taking prescription pain medicines.  Drink enough fluid to keep your urine clear or pale yellow.  Do not drink alcohol. If you need help quitting, ask your health care provider.  Do not use any tobacco products, such as cigarettes, chewing tobacco, and e-cigarettes. If you need help quitting, ask  your health care provider.  Follow a diet as told by your health care provider or dietitian, if this applies. This may include: ? Limiting how much fat you eat. ? Eating smaller meals more often. ? Avoiding caffeine.  Keep all follow-up visits as told by your health care provider. This is important. Contact a health care provider if:  You have pain that does not get better with medicine.  You have a fever. Get help right away if:  Your pain suddenly gets worse.  You have sudden abdominal swelling.  You start to vomit often or you vomit blood.  You have diarrhea that does not go away.  You have blood in your stool. This information is not intended to replace advice given to you by your health care provider. Make sure you discuss any questions you have with your health care provider. Document Released: 05/10/2015 Document Revised: 09/19/2015 Document Reviewed: 03/21/2014 Elsevier Interactive Patient Education  2018 [employer].   Acute Pancreatitis Acute pancreatitis is a condition in which the pancreas suddenly gets irritated and swollen (has inflammation). The pancreas is a large gland behind the stomach. It makes enzymes that help to digest food. The pancreas also makes hormones that help to control your blood sugar. Acute pancreatitis happens when the enzymes attack the pancreas and damage it. Most attacks last a couple of days and can cause serious problems. Follow these instructions at home: Eating and drinking  Follow instructions from your doctor about diet. You may need to: ? Avoid alcohol. ? Limit how much fat is in your diet.  Eat small meals often. Avoid eating big meals.  Drink enough fluid to keep your pee (urine) clear or pale yellow.  Do not drink alcohol if it caused your condition. General instructions  Take over-the-counter and prescription medicines only as told by your doctor.  Do not use any tobacco products. These include cigarettes, chewing  tobacco, and e-cigarettes. If you need help quitting, ask your doctor.  Get plenty of rest.  If directed, check your blood sugar at home as told by your doctor.  Keep all follow-up visits as told by your doctor. This is important. Contact a doctor if:  You do not get better as quickly as expected.  You have new symptoms.  Your symptoms get worse.  You have lasting pain or weakness.  You continue to feel sick to your stomach (nauseous).  You get better and then you have another pain attack.  You have a fever. Get help right away if:  You cannot eat or keep fluids down.  Your pain becomes very bad.  Your skin or the white part  of your eyes turns yellow (jaundice).  You throw up (vomit).  You feel dizzy or you pass out (faint).  Your blood sugar is high (over 300 mg/dL). This information is not intended to replace advice given to you by your health care provider. Make sure you discuss any questions you have with your health care provider. Document Released: 09/30/2007 Document Revised: 09/19/2015 Document Reviewed: 01/15/2015 Elsevier Interactive Patient Education  2018 [employer].

## 2018-01-25 ENCOUNTER — Emergency Department (HOSPITAL_COMMUNITY): Payer: BLUE CROSS/BLUE SHIELD

## 2018-01-25 ENCOUNTER — Inpatient Hospital Stay (HOSPITAL_COMMUNITY)
Admission: EM | Admit: 2018-01-25 | Discharge: 2018-01-29 | DRG: 919 | Disposition: A | Discharge status: Home / Self Care | Payer: BLUE CROSS/BLUE SHIELD | Attending: Family Medicine | Admitting: Family Medicine

## 2018-01-25 ENCOUNTER — Other Ambulatory Visit: Payer: Self-pay

## 2018-01-25 ENCOUNTER — Encounter (HOSPITAL_COMMUNITY): Payer: Self-pay

## 2018-01-25 DIAGNOSIS — E11649 Type 2 diabetes mellitus with hypoglycemia without coma: Secondary | ICD-10-CM | POA: Diagnosis present

## 2018-01-25 DIAGNOSIS — Y848 Other medical procedures as the cause of abnormal reaction of the patient, or of later complication, without mention of misadventure at the time of the procedure: Secondary | ICD-10-CM | POA: Diagnosis present

## 2018-01-25 DIAGNOSIS — D696 Thrombocytopenia, unspecified: Secondary | ICD-10-CM | POA: Diagnosis present

## 2018-01-25 DIAGNOSIS — Z794 Long term (current) use of insulin: Secondary | ICD-10-CM

## 2018-01-25 DIAGNOSIS — E860 Dehydration: Secondary | ICD-10-CM | POA: Diagnosis present

## 2018-01-25 DIAGNOSIS — Z79891 Long term (current) use of opiate analgesic: Secondary | ICD-10-CM

## 2018-01-25 DIAGNOSIS — Z9049 Acquired absence of other specified parts of digestive tract: Secondary | ICD-10-CM | POA: Diagnosis not present

## 2018-01-25 DIAGNOSIS — Z720 Tobacco use: Secondary | ICD-10-CM | POA: Diagnosis not present

## 2018-01-25 DIAGNOSIS — T85590A Other mechanical complication of bile duct prosthesis, initial encounter: Secondary | ICD-10-CM | POA: Diagnosis present

## 2018-01-25 DIAGNOSIS — R1011 Right upper quadrant pain: Secondary | ICD-10-CM | POA: Diagnosis present

## 2018-01-25 DIAGNOSIS — F17210 Nicotine dependence, cigarettes, uncomplicated: Secondary | ICD-10-CM | POA: Diagnosis present

## 2018-01-25 DIAGNOSIS — K859 Acute pancreatitis without necrosis or infection, unspecified: Secondary | ICD-10-CM | POA: Diagnosis present

## 2018-01-25 DIAGNOSIS — K861 Other chronic pancreatitis: Secondary | ICD-10-CM | POA: Diagnosis present

## 2018-01-25 DIAGNOSIS — E1165 Type 2 diabetes mellitus with hyperglycemia: Secondary | ICD-10-CM | POA: Diagnosis not present

## 2018-01-25 DIAGNOSIS — K852 Alcohol induced acute pancreatitis without necrosis or infection: Secondary | ICD-10-CM

## 2018-01-25 DIAGNOSIS — R7401 Elevation of levels of liver transaminase levels: Secondary | ICD-10-CM | POA: Diagnosis present

## 2018-01-25 DIAGNOSIS — R739 Hyperglycemia, unspecified: Secondary | ICD-10-CM | POA: Diagnosis not present

## 2018-01-25 DIAGNOSIS — Z79899 Other long term (current) drug therapy: Secondary | ICD-10-CM | POA: Diagnosis not present

## 2018-01-25 DIAGNOSIS — R740 Nonspecific elevation of levels of transaminase and lactic acid dehydrogenase [LDH]: Secondary | ICD-10-CM | POA: Diagnosis not present

## 2018-01-25 DIAGNOSIS — K831 Obstruction of bile duct: Secondary | ICD-10-CM

## 2018-01-25 LAB — COMPREHENSIVE METABOLIC PANEL
ALBUMIN: 3.5 g/dL (ref 3.5–5.0)
ALK PHOS: 825 U/L — AB (ref 38–126)
ALT: 197 U/L — ABNORMAL HIGH (ref 0–44)
ANION GAP: 11 (ref 5–15)
AST: 253 U/L — ABNORMAL HIGH (ref 15–41)
BILIRUBIN TOTAL: 1.3 mg/dL — AB (ref 0.3–1.2)
BUN: 8 mg/dL (ref 6–20)
CALCIUM: 8.9 mg/dL (ref 8.9–10.3)
CO2: 25 mmol/L (ref 22–32)
Chloride: 97 mmol/L — ABNORMAL LOW (ref 98–111)
Creatinine, Ser: 0.52 mg/dL (ref 0.44–1.00)
GFR calc Af Amer: >60 mL/min (ref >60)
GLUCOSE: 373 mg/dL — AB (ref 70–99)
Potassium: 3.7 mmol/L (ref 3.5–5.1)
Sodium: 133 mmol/L — ABNORMAL LOW (ref 135–145)
TOTAL PROTEIN: 7.1 g/dL (ref 6.5–8.1)

## 2018-01-25 LAB — DIFFERENTIAL
BASOS ABS: 0 10*3/uL (ref 0.0–0.1)
Basophils Relative: 0 %
Eosinophils Absolute: 0 10*3/uL (ref 0.0–0.7)
Eosinophils Relative: 0 %
LYMPHS ABS: 1.5 10*3/uL (ref 0.7–4.0)
Lymphocytes Relative: 16 %
MONOS PCT: 5 %
Monocytes Absolute: 0.5 10*3/uL (ref 0.1–1.0)
NEUTROS ABS: 7.4 10*3/uL (ref 1.7–7.7)
Neutrophils Relative %: 79 %

## 2018-01-25 LAB — URINALYSIS, ROUTINE W REFLEX MICROSCOPIC
Bilirubin Urine: NEGATIVE
Glucose, UA: >=500 mg/dL — AB
Hgb urine dipstick: NEGATIVE
KETONES UR: NEGATIVE mg/dL
Leukocytes, UA: NEGATIVE
NITRITE: NEGATIVE
PH: 8 (ref 5.0–8.0)
Protein, ur: NEGATIVE mg/dL
Specific Gravity, Urine: 1.032 — ABNORMAL HIGH (ref 1.005–1.030)

## 2018-01-25 LAB — I-STAT CG4 LACTIC ACID, ED: Lactic Acid, Venous: 1.1 mmol/L (ref 0.5–1.9)

## 2018-01-25 LAB — CBG MONITORING, ED: GLUCOSE-CAPILLARY: 401 mg/dL — AB (ref 70–99)

## 2018-01-25 LAB — CBC
HCT: 38.5 % (ref 36.0–46.0)
HEMOGLOBIN: 12.9 g/dL (ref 12.0–15.0)
MCH: 30.1 pg (ref 26.0–34.0)
MCHC: 33.5 g/dL (ref 30.0–36.0)
MCV: 90 fL (ref 78.0–100.0)
Platelets: 163 10*3/uL (ref 150–400)
RBC: 4.28 MIL/uL (ref 3.87–5.11)
RDW: 16.5 % — ABNORMAL HIGH (ref 11.5–15.5)
WBC: 9.2 10*3/uL (ref 4.0–10.5)

## 2018-01-25 LAB — PHOSPHORUS: Phosphorus: 2.7 mg/dL (ref 2.5–4.6)

## 2018-01-25 LAB — MAGNESIUM: Magnesium: 1.7 mg/dL (ref 1.7–2.4)

## 2018-01-25 LAB — I-STAT BETA HCG BLOOD, ED (MC, WL, AP ONLY): I-stat hCG, quantitative: <5 m[IU]/mL (ref <5)

## 2018-01-25 LAB — LIPASE, BLOOD: Lipase: 109 U/L — ABNORMAL HIGH (ref 11–51)

## 2018-01-25 LAB — CK: Total CK: 50 U/L (ref 38–234)

## 2018-01-25 MED ORDER — IOPAMIDOL (ISOVUE-300) INJECTION 61%
100.0000 mL | Freq: Once | INTRAVENOUS | Status: AC | PRN
  Administered 2018-01-25: 100 mL via INTRAVENOUS

## 2018-01-25 MED ORDER — SODIUM CHLORIDE 0.9 % IV BOLUS
1000.0000 mL | Freq: Once | INTRAVENOUS | Status: AC
  Administered 2018-01-25: 1000 mL via INTRAVENOUS

## 2018-01-25 MED ORDER — HYDROMORPHONE HCL 1 MG/ML IJ SOLN
1.0000 mg | INTRAMUSCULAR | Status: DC | PRN
  Administered 2018-01-25: 1 mg via INTRAVENOUS
  Filled 2018-01-25: qty 1

## 2018-01-25 MED ORDER — ONDANSETRON HCL 4 MG/2ML IJ SOLN
4.0000 mg | Freq: Once | INTRAMUSCULAR | Status: AC | PRN
  Administered 2018-01-25: 4 mg via INTRAVENOUS
  Filled 2018-01-25: qty 2

## 2018-01-25 MED ORDER — METOCLOPRAMIDE HCL 5 MG/ML IJ SOLN
10.0000 mg | Freq: Four times a day (QID) | INTRAMUSCULAR | Status: DC | PRN
  Administered 2018-01-26: 10 mg via INTRAVENOUS
  Filled 2018-01-25: qty 2

## 2018-01-25 MED ORDER — INSULIN ASPART 100 UNIT/ML ~~LOC~~ SOLN
10.0000 [IU] | Freq: Once | SUBCUTANEOUS | Status: AC
  Administered 2018-01-25: 10 [IU] via SUBCUTANEOUS
  Filled 2018-01-25: qty 1

## 2018-01-25 MED ORDER — METOCLOPRAMIDE HCL 5 MG/ML IJ SOLN
10.0000 mg | Freq: Once | INTRAMUSCULAR | Status: AC
  Administered 2018-01-25: 10 mg via INTRAVENOUS
  Filled 2018-01-25: qty 2

## 2018-01-25 MED ORDER — HYDROMORPHONE HCL 1 MG/ML IJ SOLN
1.0000 mg | Freq: Once | INTRAMUSCULAR | Status: AC
  Administered 2018-01-25: 1 mg via INTRAVENOUS
  Filled 2018-01-25: qty 1

## 2018-01-25 MED ORDER — HYDROMORPHONE HCL 1 MG/ML IJ SOLN
1.0000 mg | INTRAMUSCULAR | Status: DC | PRN
  Administered 2018-01-25 – 2018-01-26 (×3): 1 mg via INTRAVENOUS
  Filled 2018-01-25 (×4): qty 1

## 2018-01-25 MED ORDER — IOPAMIDOL (ISOVUE-300) INJECTION 61%
INTRAVENOUS | Status: AC
  Filled 2018-01-25: qty 100

## 2018-01-25 NOTE — ED Provider Notes (Signed)
[place] DEPT Provider Note   CSN: [id number] Arrival date & time: 01/25/18  1546     History   Chief Complaint Chief Complaint  Patient presents with  . Abdominal Pain  . Nausea    HPI Analyssa Hunt is a 37 y.o. female with a history of IDDM, pancreatitis, and alcohol use in remission who presents to the emergency department with a chief complaint of right upper quadrant pain.  The patient endorses constant right upper quadrant pain that she describes as sharp and burning since onset yesterday.  Pain is nonradiating.  She also endorses diffuse pain throughout the abdomen that she characterizes as "popping" that comes and goes.  Constant right upper quadrant pain is worse with positional changes.  No known aggravating or relieving factors for the popping pain.  She reports associated nausea.  Denies fever, chills, diarrhea, emesis, constipation, hematuria, dysuria, chest pain, dyspnea, back pain, vaginal pain or discharge.  She reports that she checked her blood sugar at lunchtime and it was in the 270s and she gave herself insulin.  She has been treating her pain at home with a fentanyl patch and oxycodone.  Last dose of oxycodone was at 2 PM with minimal to no relief.  States that she has not drank alcohol in several months.  Was most recently admitted from 9/21-23/19 for acute on chronic pancreatitis.  She has a stent in place and there was concern for possible small necrosis.  She was advised to follow-up with her by our gastroenterology after her discharge, but she has not yet made an appointment.  The history is provided by the patient. No language interpreter was used.    Past Medical History:  Diagnosis Date  . Diabetes mellitus without complication ([place])   . Pancreatitis     Patient Active Problem List   Diagnosis Date Noted  . Transaminitis 01/25/2018  . Pancreatitis 01/25/2018  . Dehydration 01/25/2018  . Hyperglycemia 01/25/2018    . Acute on chronic pancreatitis ([place]) 01/15/2018  . IDDM (insulin dependent diabetes mellitus) ([place]) 01/15/2018    Past Surgical History:  Procedure Laterality Date  . BILE DUCT STENT PLACEMENT    . KNEE SURGERY    . pancreas stent    . SYMPATHECTOMY       OB History   None      Home Medications    Prior to Admission medications   Medication Sig Start Date End Date Taking? Authorizing Provider  escitalopram (LEXAPRO) 10 MG tablet Take 10 mg by mouth daily.    Yes [provider]  fentaNYL (DURAGESIC - DOSED MCG/HR) 12 MCG/HR Place 25 mcg onto the skin every 3 (three) days.    Yes [provider]  insulin degludec (TRESIBA FLEXTOUCH) 100 UNIT/ML SOPN FlexTouch Pen Inject 0.26 mLs (26 Units total) into the skin daily. 01/17/18  Yes Hall, Carole N, DO  insulin regular (HUMULIN R) 100 units/mL injection Inject 0.01-0.1 mLs (1-10 Units total) into the skin 3 (three) times daily before meals. Sliding scale 01/17/18  Yes Kayleen Memos, DO  lipase/protease/amylase (CREON) 12000 units CPEP capsule Take 2 capsules (24,000 Units total) by mouth 3 (three) times daily. 01/17/18  Yes Irene Pap N, DO  ondansetron (ZOFRAN ODT) 4 MG disintegrating tablet '4mg'$  ODT q4 hours prn nausea/vomit 01/13/18  Yes Milton Ferguson, MD  pantoprazole (PROTONIX) 40 MG tablet Take 40 mg by mouth daily. 10/12/17  Yes [provider]  potassium chloride (K-DUR) 10 MEQ tablet Take 1  tablet (10 mEq total) by mouth daily. 01/13/18  Yes Milton Ferguson, MD  pregabalin (LYRICA) 50 MG capsule Take 50 mg by mouth 3 (three) times daily.   Yes [provider]  promethazine (PHENERGAN) 12.5 MG tablet Take 12.5 mg by mouth every 6 (six) hours as needed for nausea or vomiting.  11/11/17  Yes [provider]  blood glucose meter kit and supplies KIT Dispense based on patient and insurance preference. Use up to four times daily as directed. (FOR ICD-9 250.00, 250.01). 12/07/17   Waynetta Pean, PA-C  HYDROcodone-acetaminophen (NORCO/VICODIN) 5-325 MG tablet Take 1 tablet by mouth every 6 (six) hours as needed for moderate pain. Patient not taking: Reported on 01/25/2018 01/13/18   Milton Ferguson, MD  nicotine (NICODERM CQ - DOSED IN MG/24 HOURS) 14 mg/24hr patch Place 1 patch (14 mg total) onto the skin daily. Patient not taking: Reported on 01/25/2018 01/18/18   Kayleen Memos, DO    Family History Family History  Problem Relation Age of Onset  . Cancer Father   . Lung cancer Father   . Diabetes Neg Hx   . Stroke Neg Hx   . CAD Neg Hx     Social History Social History   Tobacco Use  . Smoking status: Current Every Day Smoker    Packs/day: 0.50    Types: Cigarettes  . Smokeless tobacco: Never Used  Substance Use Topics  . Alcohol use: Not Currently  . Drug use: Never     Allergies   Morphine and related   Review of Systems Review of Systems  Constitutional: Negative for activity change, chills and fever.  Respiratory: Negative for shortness of breath.   Cardiovascular: Negative for chest pain, palpitations and leg swelling.  Gastrointestinal: Positive for abdominal pain and nausea. Negative for abdominal distention, anal bleeding, blood in stool, constipation, diarrhea, rectal pain and vomiting.  Genitourinary: Negative for dysuria, flank pain, hematuria, urgency, vaginal bleeding and vaginal discharge.  Musculoskeletal: Negative for back pain.  Skin: Negative for rash.  Allergic/Immunologic: Negative for immunocompromised state.  Neurological: Negative for dizziness, weakness, numbness and headaches.  Psychiatric/Behavioral: Negative for confusion.     Physical Exam Updated Vital Signs BP 96/68   Pulse 72   Temp 98.6 F (37 C) (Oral)   Resp 18   Ht '5\' 6"'$  (1.676 m)   Wt 59 kg   SpO2 95%   BMI 20.98 kg/m   Physical Exam  Constitutional: No distress.  Appears uncomfortable.  HENT:  Head: Normocephalic.  Eyes: Conjunctivae are normal.   Neck: Neck supple.  Cardiovascular: Normal rate, regular rhythm, normal heart sounds and intact distal pulses. Exam reveals no gallop and no friction rub.  No murmur heard. Pulmonary/Chest: Effort normal. No stridor. No respiratory distress. She has no wheezes. She has no rales. She exhibits no tenderness.  Abdominal: Soft. Bowel sounds are normal. She exhibits no distension and no mass. There is tenderness. There is no rebound and no guarding. No hernia.  Looks exquisitely uncomfortable when repositioning the patient from sitting to laying flat.  Significantly tender to palpation to the upper abdomen, right greater than left.  Patient becomes tearful with palpation in the epigastric and right upper quadrant. No lower abdominal tenderness. No CVA tenderness bilaterally.  No palpable masses, hernias, organomegaly. Hyperactive BS in all 4 quadrants.    Neurological: She is alert.  Skin: Skin is warm. No rash noted.  Psychiatric: Her behavior is normal.  Nursing note and vitals reviewed.  ED Treatments / Results  Labs (all labs ordered are listed, but only abnormal results are displayed) Labs Reviewed  LIPASE, BLOOD - Abnormal; Notable for the following components:      Result Value   Lipase 109 (*)    All other components within normal limits  COMPREHENSIVE METABOLIC PANEL - Abnormal; Notable for the following components:   Sodium 133 (*)    Chloride 97 (*)    Glucose, Bld 373 (*)    AST 253 (*)    ALT 197 (*)    Alkaline Phosphatase 825 (*)    Total Bilirubin 1.3 (*)    All other components within normal limits  CBC - Abnormal; Notable for the following components:   RDW 16.5 (*)    All other components within normal limits  URINALYSIS, ROUTINE W REFLEX MICROSCOPIC - Abnormal; Notable for the following components:   Specific Gravity, Urine 1.032 (*)    Glucose, UA >=500 (*)    Bacteria, UA RARE (*)    All other components within normal limits  CBG MONITORING, ED - Abnormal;  Notable for the following components:   Glucose-Capillary 401 (*)    All other components within normal limits  CK  DIFFERENTIAL  MAGNESIUM  PHOSPHORUS  PREALBUMIN  HEPATITIS PANEL, ACUTE  ACETAMINOPHEN LEVEL  I-STAT BETA HCG BLOOD, ED ([facility], [facility], [facility] ONLY)  I-STAT CG4 LACTIC ACID, ED    EKG None  Radiology Ct Abdomen Pelvis W Contrast  Result Date: 01/25/2018 CLINICAL DATA:  Abdominal pain, history of recurrent pancreatitis EXAM: CT ABDOMEN AND PELVIS WITH CONTRAST TECHNIQUE: Multidetector CT imaging of the abdomen and pelvis was performed using the standard protocol following bolus administration of intravenous contrast. CONTRAST:  151m ISOVUE-300 IOPAMIDOL (ISOVUE-300) INJECTION 61% COMPARISON:  01/13/2018 FINDINGS: Lower chest: Minor dependent atelectasis/hyperventilation. No lower lobe pneumonia. Normal heart size. No pericardial pleural effusion. Hepatobiliary: Stable diffuse biliary dilatation. Remote cholecystectomy noted. Endoscopic pancreatic and common bile duct stents are stable in position. No new focal hepatic abnormality. Pancreas: Similar appearance of diffuse pancreatic edema with decreased parenchymal enhancement. Diffuse coarse pancreatic calcifications. Stable pancreatic ductal dilatation with a proximal pancreatic stent extending into the duodenum. Similar peripancreatic mild strandy edema/fluid. Multifocal pancreatic loculated fluid collections as before, largest in the pancreatic head region is 2.0 cm, previously 2.4 cm. No new fluid collections. Overall stable appearance of acute on chronic pancreatitis. No significant interval change or new finding. Spleen: Normal in size without focal abnormality. Adrenals/Urinary Tract: Normal adrenal glands. No acute renal obstruction, hydronephrosis, hydroureter, or obstructing ureteral calculus. Urinary bladder unremarkable. Left kidney demonstrates a lower pole posterior cortical fat attenuation lesion compatible with an angiomyolipoma  measuring 12 mm, image 41 series 2. Stomach/Bowel: Negative for bowel obstruction, significant dilatation, ileus, or free air. Normal appendix demonstrated. Moderate colonic stool burden. No fluid collection or abscess. Overall stable appearance. Vascular/Lymphatic: Aortic atherosclerosis noted without occlusive process or aneurysm. No retroperitoneal hemorrhage. Mesenteric and renal vasculature remain patent. No adenopathy. Stable enlargement of the portal vein and mesenteric veins without portal vein occlusion or thrombus. 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 Reproductive: Uterus normal in size. Right ovary and adnexa unremarkable. Left ovary contains a hypodense cyst measuring 5.1 cm. Other: No abdominal wall hernia or abnormality. No abdominopelvic ascites. 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 Stable pancreatic  duct dilatation and diffuse biliary dilatation with unchanged endoscopic common bile duct and pancreatic stents. No new fluid collection or abscess. 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 Portal vein and mesenteric veins remain patent. 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  Shick M.D.   On: 01/25/2018 19:27    Procedures Procedures (including critical care time)  Medications Ordered in ED Medications  iopamidol (ISOVUE-300) 61 % injection (has no administration in time range)  metoCLOPramide (REGLAN) injection 10 mg (has no administration in time range)  HYDROmorphone (DILAUDID) injection 1 mg (1 mg Intravenous Given 01/25/18 2307)  HYDROmorphone  (DILAUDID) injection 1 mg (1 mg Intravenous Given 01/25/18 1808)  sodium chloride 0.9 % bolus 1,000 mL (0 mLs Intravenous Stopped 01/25/18 1915)  metoCLOPramide (REGLAN) injection 10 mg (10 mg Intravenous Given 01/25/18 1809)  iopamidol (ISOVUE-300) 61 % injection 100 mL (100 mLs Intravenous Contrast Given 01/25/18 1822)  ondansetron (ZOFRAN) injection 4 mg (4 mg Intravenous Given 01/25/18 2036)  insulin aspart (novoLOG) injection 10 Units (10 Units Subcutaneous Given 01/25/18 2155)  sodium chloride 0.9 % bolus 1,000 mL (0 mLs Intravenous Stopped 01/25/18 2331)     Initial Impression / Assessment and Plan / ED Course  I have reviewed the triage vital signs and the nursing notes.  Pertinent labs & imaging results that were available during my care of the patient were reviewed by me and considered in my medical decision making (see chart for details).     37 year old female with a history of IDDM, pancreatitis, and alcohol use in remission presenting with right upper quadrant pain that significantly worsened yesterday.  No constitutional symptoms.  She has had nausea but no vomiting.  Reports she has abstained from alcohol for the last 3 months.  Vitals are stable as the patient is normotensive, afebrile, and no tachycardia.  Labs are notable for AST of 253, ALT 197, alk phos 825, and total bilirubin 1.3.  Glucose is 373, but she has a normal anion gap and bicarb is 25.  Lipase is 109.  This is a significant elevation since blood work performed on 12/27/2017 had AST 94, ALT 41, alk phos 166, and total bilirubin 0.3.  Lipase 74.   Previous admission from 9 21-9 23 was reviewed.  General surgery was consulted during admission as CT abdomen pelvis showed small cystic areas, the largest measuring 2.5 cm, around the pancreas.  Pancreatic stent was in place and the pancreatic duct was not dilated.  No large or significant areas of necrosis or evidence of infection were noted in general surgery signed off.  The  patient recently relocated to the area and has not established with a gastroenterologist since moving.  She was given a referral to gastroenterology, but did not follow-up between this admission and today. Pancreatic stet was placed in March 2019 by Dr. Francella Solian.   Repeat CT abdomen pelvis today with stable acute on chronic pancreatitis with similar small loculated pancreatic fluid collections that are concerning for chronic pseudocyst.  Pancreatic duct dilatation is stable and there is diffuse biliary dilatation with unchanged endoscopic common bile duct and pancreatic stents.  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  Given no acute changes on imaging with acutely elevated  transaminases and alk phos, gastroenterology was consulted. Spoke with Dr. Michail Sermon who recommended bowel rest and pain control.  GI will evaluate the patient in the morning.  Pain is been controlled with Dilaudid in the ED.  Nausea controlled with Reglan.  No episodes of emesis in the ER.  Consulted the hospitalist team and spoke with Dr. Roel Cluck who will accept the patient for admission. The patient appears reasonably stabilized for admission considering the current resources, flow, and capabilities available in the ED at this time, and I doubt any other [facility] requiring further screening and/or treatment in the ED prior to admission.  Final Clinical Impressions(s) / ED Diagnoses   Final diagnoses:  Acute on chronic pancreatitis [facility])    ED Discharge Orders    None       Joanne Gavel, PA-C 01/26/18 0136    Mesner, Corene Cornea, MD 01/28/18 9030

## 2018-01-25 NOTE — H&P (Addendum)
Heather Hunt NLZ:[id number] DOB: 02-28-1981 DOA: 01/25/2018     PCP: Patient, No Pcp Per   Outpatient Specialists:  NONE    Patient arrived to ER on 01/25/18 at 1546  Patient coming from: home Lives with Roomate Chief Complaint:  Chief Complaint  Patient presents with  . Abdominal Pain  . Nausea    HPI: Heather Hunt is a 37 y.o. female with medical history significant of pancreatitis, DM2 on insulin, ETOH abuse now in remmision    Presented with severe epigasrtric/RUQ  Pain Last admission was in September at   that time General surgery has been consulted patient has stent in place No indication for surgical intervention recommended that patient   will need to have follow-up with GI at [facility] note today her BG 270 this AM used insulin Past patient required PEG tube in 2018 secondary to poor nutrition.  Patient has history of moderate protein calorie malnutrition during prior admission she had transient elevation of LFTs which improved  Regarding pertinent Chronic problems: Hx of pancreatic stent   While in ER: oted to have elevated LFT Lipase 109 CTshowedacute onchronicpancreatitis  Continues to be in paindespite Dilaudid doses The following Work up has been ordered so far:  Orders Placed This Encounter  Procedures  . CT ABDOMEN PELVIS W CONTRAST  . Lipase, blood  . Comprehensive metabolic panel  . CBC  . Urinalysis, Routine w reflex microscopic  . Differential  . Diet NPO time specified  . Saline Lock IV, Maintain IV access  . Consult to gastroenterology  . Consult to hospitalist  . I-Stat beta hCG blood, ED     Following Medications were ordered in ER: Medications  iopamidol (ISOVUE-300) 61 % injection (has no administration in time range)  HYDROmorphone (DILAUDID) injection 1 mg (has no administration in time range)  ondansetron (ZOFRAN) injection 4 mg (has no administration in time range)  HYDROmorphone (DILAUDID) injection 1 mg (1 mg Intravenous  Given 01/25/18 1808)  sodium chloride 0.9 % bolus 1,000 mL (0 mLs Intravenous Stopped 01/25/18 1915)  metoCLOPramide (REGLAN) injection 10 mg (10 mg Intravenous Given 01/25/18 1809)  iopamidol (ISOVUE-300) 61 % injection 100 mL (100 mLs Intravenous Contrast Given 01/25/18 1822)    Significant initial  Findings: Abnormal Labs Reviewed  LIPASE, BLOOD - Abnormal; Notable for the following components:      Result Value   Lipase 109 (*)    All other components within normal limits  COMPREHENSIVE METABOLIC PANEL - Abnormal; Notable for the following components:   Sodium 133 (*)    Chloride 97 (*)    Glucose, Bld 373 (*)    AST 253 (*)    ALT 197 (*)    Alkaline Phosphatase 825 (*)    Total Bilirubin 1.3 (*)    All other components within normal limits  CBC - Abnormal; Notable for the following components:   RDW 16.5 (*)    All other components within normal limits  URINALYSIS, ROUTINE W REFLEX MICROSCOPIC - Abnormal; Notable for the following components:   Specific Gravity, Urine 1.032 (*)    Glucose, UA >=500 (*)    Bacteria, UA RARE (*)    All other components within normal limits    Na 133 K 3.7  Cr    Stable,  Lab Results  Component Value Date   CREATININE 0.52 01/25/2018   CREATININE 0.43 (L) 01/17/2018   CREATININE 0.36 (L) 01/16/2018    Lipase 109  WBC 9.2  HG/HCT  stable,      Component Value Date/Time   HGB 12.9 01/25/2018 1620   HCT 38.5 01/25/2018 1620    Lactic Acid, Venous    Component Value Date/Time   LATICACIDVEN 1.10 01/25/2018 1814   CK 50   UA  not ordered    CTabd/pelvis -  Acute on chronic pancreatitis no fluid collection or abscess  ECG:  Not ordered    ED Triage Vitals  Enc Vitals Group     BP 01/25/18 1559 112/75     Pulse Rate 01/25/18 1559 (!) 101     Resp 01/25/18 1559 16     Temp 01/25/18 1559 98.6 F (37 C)     Temp Source 01/25/18 1559 Oral     SpO2 01/25/18 1559 98 %     Weight 01/25/18 1601 130 lb (59 kg)     Height  01/25/18 1601 '5\' 6"'$  (1.676 m)     Head Circumference --      Peak Flow --      Pain Score 01/25/18 1559 8     Pain Loc --      Pain Edu? --      Excl. in [place]? --   TMAX(24)@       Latest  Blood pressure 106/67, pulse 90, temperature 98.6 F (37 C), temperature source Oral, resp. rate 16, height '5\' 6"'$  (1.676 m), weight 59 kg, SpO2 99 %.     ER Provider Called:  [facility]   Dr.Schooler They Recommend pain conrol Will see in AM   Hospitalist was called for admission for acute on chronic pancratitis   Review of Systems:    Pertinent positives include:  fatigue, abdominal pain, nausea,  Constitutional:  No weight loss, night sweats, Fevers, chills, weight loss  HEENT:  No headaches, Difficulty swallowing,Tooth/dental problems,Sore throat,  No sneezing, itching, ear ache, nasal congestion, post nasal drip,  Cardio-vascular:  No chest pain, Orthopnea, PND, anasarca, dizziness, palpitations.no Bilateral lower extremity swelling  GI:  No heartburn, indigestion, vomiting, diarrhea, change in bowel habits, loss of appetite, melena, blood in stool, hematemesis Resp:  no shortness of breath at rest. No dyspnea on exertion, No excess mucus, no productive cough, No non-productive cough, No coughing up of blood.No change in color of mucus.No wheezing. Skin:  no rash or lesions. No jaundice GU:  no dysuria, change in color of urine, no urgency or frequency. No straining to urinate.  No flank pain.  Musculoskeletal:  No joint pain or no joint swelling. No decreased range of motion. No back pain.  Psych:  No change in mood or affect. No depression or anxiety. No memory loss.  Neuro: no localizing neurological complaints, no tingling, no weakness, no double vision, no gait abnormality, no slurred speech, no confusion  All systems reviewed and apart from Sorrel all are negative  Past Medical History:   Past Medical History:  Diagnosis Date  . Diabetes mellitus without complication ([place])    . Pancreatitis       Past Surgical History:  Procedure Laterality Date  . BILE DUCT STENT PLACEMENT    . KNEE SURGERY    . pancreas stent    . SYMPATHECTOMY      Social History:  Ambulatory   independently     reports that she has been smoking cigarettes. She has been smoking about 0.50 packs per day. She has never used smokeless tobacco. She reports that she drank alcohol. She reports that she does not use drugs.  Family History:   Family History  Problem Relation Age of Onset  . Cancer Father   . Lung cancer Father   . Diabetes Neg Hx   . Stroke Neg Hx   . CAD Neg Hx     Allergies: Allergies  Allergen Reactions  . Morphine And Related Hives     Prior to Admission medications   Medication Sig Start Date End Date Taking? Authorizing Provider  escitalopram (LEXAPRO) 10 MG tablet Take 10 mg by mouth daily.    Yes [provider]  fentaNYL (DURAGESIC - DOSED MCG/HR) 12 MCG/HR Place 12.5 mcg onto the skin every 3 (three) days.    Yes [provider]  insulin degludec (TRESIBA FLEXTOUCH) 100 UNIT/ML SOPN FlexTouch Pen Inject 0.26 mLs (26 Units total) into the skin daily. 01/17/18  Yes Hall, Carole N, DO  insulin regular (HUMULIN R) 100 units/mL injection Inject 0.01-0.1 mLs (1-10 Units total) into the skin 3 (three) times daily before meals. Sliding scale 01/17/18  Yes Kayleen Memos, DO  lipase/protease/amylase (CREON) 12000 units CPEP capsule Take 2 capsules (24,000 Units total) by mouth 3 (three) times daily. 01/17/18  Yes Irene Pap N, DO  ondansetron (ZOFRAN ODT) 4 MG disintegrating tablet '4mg'$  ODT q4 hours prn nausea/vomit 01/13/18  Yes Milton Ferguson, MD  pantoprazole (PROTONIX) 40 MG tablet Take 40 mg by mouth daily. 10/12/17  Yes [provider]  potassium chloride (K-DUR) 10 MEQ tablet Take 1 tablet (10 mEq total) by mouth daily. 01/13/18  Yes Milton Ferguson, MD  pregabalin (LYRICA) 50 MG capsule Take 50 mg by mouth 3 (three) times daily.    Yes [provider]  promethazine (PHENERGAN) 12.5 MG tablet Take 12.5 mg by mouth every 6 (six) hours as needed for nausea or vomiting.  11/11/17  Yes [provider]  blood glucose meter kit and supplies KIT Dispense based on patient and insurance preference. Use up to four times daily as directed. (FOR ICD-9 250.00, 250.01). 12/07/17   Waynetta Pean, PA-C  HYDROcodone-acetaminophen (NORCO/VICODIN) 5-325 MG tablet Take 1 tablet by mouth every 6 (six) hours as needed for moderate pain. Patient not taking: Reported on 01/25/2018 01/13/18   Milton Ferguson, MD  nicotine (NICODERM CQ - DOSED IN MG/24 HOURS) 14 mg/24hr patch Place 1 patch (14 mg total) onto the skin daily. Patient not taking: Reported on 01/25/2018 01/18/18   Kayleen Memos, DO   Physical Exam: Blood pressure 106/67, pulse 90, temperature 98.6 F (37 C), temperature source Oral, resp. rate 16, height '5\' 6"'$  (1.676 m), weight 59 kg, SpO2 99 %. 1. General:  in No Acute distress   Chronically ill -appearing 2. Psychological: Alert and Oriented 3. Head/ENT:    Dry Mucous Membranes                          Head Non traumatic, neck supple                          Normal  Dentition 4. SKIN:   decreased Skin turgor,  Skin clean Dry and intact no rash 5. Heart: Regular rate and rhythm no Murmur, no Rub or gallop 6. Lungs:  Clear to auscultation bilaterally, no wheezes or crackles   7. Abdomen: Soft, epigastric tenderness, Non distended  bowel sounds present 8. Lower extremities: no clubbing, cyanosis, or  edema 9. Neurologically Grossly intact, moving all 4 extremities equally  10. MSK: Normal  range of motion   LABS:     Recent Labs  Lab 01/25/18 1620  WBC 9.2  HGB 12.9  HCT 38.5  MCV 90.0  PLT 793   Basic Metabolic Panel: Recent Labs  Lab 01/25/18 1620  NA 133*  K 3.7  CL 97*  CO2 25  GLUCOSE 373*  BUN 8  CREATININE 0.52  CALCIUM 8.9      Recent Labs  Lab 01/25/18 1620  AST 253*  ALT 197*    ALKPHOS 825*  BILITOT 1.3*  PROT 7.1  ALBUMIN 3.5   Recent Labs  Lab 01/25/18 1620  LIPASE 109*   No results for input(s): AMMONIA in the last 168 hours.    HbA1C: No results for input(s): HGBA1C in the last 72 hours. CBG: No results for input(s): GLUCAP in the last 168 hours.    Urine analysis:    Component Value Date/Time   COLORURINE YELLOW 01/25/2018 1649   APPEARANCEUR CLEAR 01/25/2018 1649   LABSPEC 1.032 (H) 01/25/2018 1649   PHURINE 8.0 01/25/2018 1649   GLUCOSEU >=500 (A) 01/25/2018 1649   HGBUR NEGATIVE 01/25/2018 1649   BILIRUBINUR NEGATIVE 01/25/2018 1649   KETONESUR NEGATIVE 01/25/2018 1649   PROTEINUR NEGATIVE 01/25/2018 1649   NITRITE NEGATIVE 01/25/2018 1649   LEUKOCYTESUR NEGATIVE 01/25/2018 1649       Cultures:    Component Value Date/Time   SDES  01/15/2018 1648    URINE, RANDOM Performed at [facility], [place] [address], [place], [place] [address]    SPECREQUEST  01/15/2018 1648    NONE Performed at [facility], [place] [address] [place], [place] [address]    CULT (A) 01/15/2018 1648    <10,000 COLONIES/mL INSIGNIFICANT GROWTH Performed at [place] [address], [place], [place] [address]    REPTSTATUS 01/17/2018 FINAL 01/15/2018 1648     Radiological Exams on Admission: Ct Abdomen Pelvis W Contrast  Result Date: 01/25/2018 CLINICAL DATA:  Abdominal pain, history of recurrent pancreatitis EXAM: CT ABDOMEN AND PELVIS WITH CONTRAST TECHNIQUE: Multidetector CT imaging of the abdomen and pelvis was performed using the standard protocol following bolus administration of intravenous contrast. CONTRAST:  137m ISOVUE-300 IOPAMIDOL (ISOVUE-300) INJECTION 61% COMPARISON:  01/13/2018 FINDINGS: Lower chest: Minor dependent atelectasis/hyperventilation. No lower lobe pneumonia. Normal heart size. No pericardial pleural effusion. Hepatobiliary: Stable diffuse biliary dilatation. Remote cholecystectomy  noted. Endoscopic pancreatic and common bile duct stents are stable in position. No new focal hepatic abnormality. Pancreas: Similar appearance of diffuse pancreatic edema with decreased parenchymal enhancement. Diffuse coarse pancreatic calcifications. Stable pancreatic ductal dilatation with a proximal pancreatic stent extending into the duodenum. Similar peripancreatic mild strandy edema/fluid. Multifocal pancreatic loculated fluid collections as before, largest in the pancreatic head region is 2.0 cm, previously 2.4 cm. No new fluid collections. Overall stable appearance of acute on chronic pancreatitis. No significant interval change or new finding. Spleen: Normal in size without focal abnormality. Adrenals/Urinary Tract: Normal adrenal glands. No acute renal obstruction, hydronephrosis, hydroureter, or obstructing ureteral calculus. Urinary bladder unremarkable. 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 Stomach/Bowel: Negative for bowel obstruction, significant dilatation, ileus, or free air. Normal appendix demonstrated. Moderate colonic stool burden. No fluid collection or abscess. Overall stable appearance. Vascular/Lymphatic: Aortic atherosclerosis noted without occlusive process or aneurysm. No retroperitoneal hemorrhage. Mesenteric and renal vasculature remain patent. No adenopathy. Stable enlargement of the portal vein and mesenteric veins  without portal vein occlusion or thrombus. 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 Reproductive: Uterus normal in size. Right ovary and adnexa unremarkable. Left ovary contains a hypodense cyst measuring 5.1 cm. Other: No abdominal wall hernia or abnormality. No abdominopelvic ascites. 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 Stable pancreatic duct dilatation and diffuse biliary dilatation with unchanged endoscopic common bile duct and pancreatic stents. No new fluid collection or abscess. 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 Portal vein and mesenteric veins remain patent. 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  Shick M.D.   On: 01/25/2018 19:27    Chart has been reviewed    Assessment/Plan 37 y.o. female with medical history significant of pancreatitis, DM1, ETOH abuse now in remmision  Admitted for acute on chronic pancreatitis  Present on Admission:  . Pancreatitis - most likely cause being alcohol induced,   CT of abdomen showing acute pancreatitis  no evidence of pseudocyst Will rehydrate with aggressive IV fluids Keep n.p.o. Follow clinically  - patient to stoped drinking alcohol 3 years ago   GI consulted IV pain control with Dilaudid Continue long standing IV pain medication with fentanyl patch . Dehydration - will rehydrate and follow . Hyperglycemia - resume home medications and follow blood sugars order sliding scale no evidence of DKA DM 2 -continue home insulin in order sliding scale rehydrate and follow blood sugars . Transaminates -in the setting of pancreatitis patient had similar presentation in the past although transaminitis has not been as severe as currently usually improved with IV fluids we will rehydrate and follow Denies acetaminophen use Does not improve would benefit from GI input Check hepatitis serologies Other plan as per orders. Tobacco abuse -  - Spoke about importance of quitting spent 5 minutes discussing options for treatment, prior attempts at quitting, and dangers  of smoking  -At this point patient is     interested in quitting  - order nicotine patch   - nursing tobacco cessation protocol  DVT prophylaxis:  SCD      Code Status:  FULL CODE as per patient   I had personally discussed CODE STATUS with patient    Family Communication:   Family not  at  Bedside    Disposition Plan:    To home once workup is complete and patient is stable                     Nutrition    consulted               Consults called: [facility]  Admission status:    inpatient     Expect 2 midnight stay secondary to severity of patient's current illness including    Severe lab abnormalities including hyperglycemia and extensive comorbidities including:  substance abuse   DM2       That are currently affecting medical management.  I expect  patient to be hospitalized for 2 midnights requiring inpatient medical care.  Patient is at high risk for adverse outcome (such as loss of life or disability) if not treated.  Indication for inpatient stay as follows:    severe pain requiring acute inpatient management,  inability to  maintain oral hydration    Need for   IV fluids,  IV medications       Level of care      medical floor              Toy Baker 01/25/2018, 9:37 PM    [facility]  Pager [phone]   after 2 AM please page floor coverage PA If 7AM-7PM, please contact the day team taking care of the patient  [email or web address]  Password [email or web address]

## 2018-01-25 NOTE — ED Triage Notes (Signed)
Patient c/o RUQ pain and nausea since last night. patient states the pain is all over but more so RUQ. Patient reports a history of pancreatitis.

## 2018-01-26 DIAGNOSIS — E860 Dehydration: Secondary | ICD-10-CM

## 2018-01-26 DIAGNOSIS — K859 Acute pancreatitis without necrosis or infection, unspecified: Secondary | ICD-10-CM

## 2018-01-26 DIAGNOSIS — R740 Nonspecific elevation of levels of transaminase and lactic acid dehydrogenase [LDH]: Secondary | ICD-10-CM

## 2018-01-26 DIAGNOSIS — R739 Hyperglycemia, unspecified: Secondary | ICD-10-CM

## 2018-01-26 LAB — PREALBUMIN: Prealbumin: 11 mg/dL — ABNORMAL LOW (ref 18–38)

## 2018-01-26 LAB — COMPREHENSIVE METABOLIC PANEL
ALK PHOS: 764 U/L — AB (ref 38–126)
ALT: 154 U/L — ABNORMAL HIGH (ref 0–44)
AST: 178 U/L — AB (ref 15–41)
Albumin: 2.9 g/dL — ABNORMAL LOW (ref 3.5–5.0)
Anion gap: 5 (ref 5–15)
BILIRUBIN TOTAL: 0.9 mg/dL (ref 0.3–1.2)
BUN: 7 mg/dL (ref 6–20)
CO2: 29 mmol/L (ref 22–32)
Calcium: 8.4 mg/dL — ABNORMAL LOW (ref 8.9–10.3)
Chloride: 110 mmol/L (ref 98–111)
Creatinine, Ser: 0.35 mg/dL — ABNORMAL LOW (ref 0.44–1.00)
GFR calc Af Amer: >60 mL/min (ref >60)
GFR calc non Af Amer: >60 mL/min (ref >60)
Glucose, Bld: 118 mg/dL — ABNORMAL HIGH (ref 70–99)
POTASSIUM: 3.1 mmol/L — AB (ref 3.5–5.1)
Sodium: 144 mmol/L (ref 135–145)
TOTAL PROTEIN: 5.8 g/dL — AB (ref 6.5–8.1)

## 2018-01-26 LAB — CBC
HEMATOCRIT: 35.6 % — AB (ref 36.0–46.0)
HEMOGLOBIN: 11.7 g/dL — AB (ref 12.0–15.0)
MCH: 29.8 pg (ref 26.0–34.0)
MCHC: 32.9 g/dL (ref 30.0–36.0)
MCV: 90.8 fL (ref 78.0–100.0)
Platelets: 145 10*3/uL — ABNORMAL LOW (ref 150–400)
RBC: 3.92 MIL/uL (ref 3.87–5.11)
RDW: 16.8 % — ABNORMAL HIGH (ref 11.5–15.5)
WBC: 5 10*3/uL (ref 4.0–10.5)

## 2018-01-26 LAB — ACETAMINOPHEN LEVEL

## 2018-01-26 LAB — CBG MONITORING, ED
Glucose-Capillary: 119 mg/dL — ABNORMAL HIGH (ref 70–99)
Glucose-Capillary: 98 mg/dL (ref 70–99)
Glucose-Capillary: 172 mg/dL — ABNORMAL HIGH (ref 70–99)

## 2018-01-26 LAB — TSH: TSH: 2.15 u[IU]/mL (ref 0.350–4.500)

## 2018-01-26 LAB — PHOSPHORUS: Phosphorus: 2.7 mg/dL (ref 2.5–4.6)

## 2018-01-26 LAB — MAGNESIUM: Magnesium: 1.9 mg/dL (ref 1.7–2.4)

## 2018-01-26 LAB — GLUCOSE, CAPILLARY
GLUCOSE-CAPILLARY: 299 mg/dL — AB (ref 70–99)
GLUCOSE-CAPILLARY: 328 mg/dL — AB (ref 70–99)

## 2018-01-26 MED ORDER — ESCITALOPRAM OXALATE 10 MG PO TABS
10.0000 mg | ORAL_TABLET | Freq: Every day | ORAL | Status: DC
  Administered 2018-01-26 – 2018-01-29 (×4): 10 mg via ORAL
  Filled 2018-01-26 (×4): qty 1

## 2018-01-26 MED ORDER — PANTOPRAZOLE SODIUM 40 MG PO TBEC
40.0000 mg | DELAYED_RELEASE_TABLET | Freq: Every day | ORAL | Status: DC
  Administered 2018-01-26 – 2018-01-29 (×4): 40 mg via ORAL
  Filled 2018-01-26 (×4): qty 1

## 2018-01-26 MED ORDER — SODIUM CHLORIDE 0.9 % IV SOLN
INTRAVENOUS | Status: AC
  Administered 2018-01-26: 16:00:00 via INTRAVENOUS

## 2018-01-26 MED ORDER — INSULIN DEGLUDEC 100 UNIT/ML ~~LOC~~ SOPN: 26.0000 [IU] | PEN_INJECTOR | Freq: Every day | SUBCUTANEOUS | Status: DC

## 2018-01-26 MED ORDER — HYDROCODONE-ACETAMINOPHEN 5-325 MG PO TABS
1.0000 | ORAL_TABLET | Freq: Four times a day (QID) | ORAL | Status: DC | PRN
  Administered 2018-01-26: 2 via ORAL
  Filled 2018-01-26: qty 2

## 2018-01-26 MED ORDER — ACETAMINOPHEN 650 MG RE SUPP: 650.0000 mg | Freq: Four times a day (QID) | RECTAL | Status: DC | PRN

## 2018-01-26 MED ORDER — INSULIN GLARGINE 100 UNIT/ML ~~LOC~~ SOLN
26.0000 [IU] | Freq: Every day | SUBCUTANEOUS | Status: DC
  Administered 2018-01-26: 26 [IU] via SUBCUTANEOUS
  Filled 2018-01-26 (×3): qty 0.26

## 2018-01-26 MED ORDER — NICOTINE 14 MG/24HR TD PT24
14.0000 mg | MEDICATED_PATCH | Freq: Every day | TRANSDERMAL | Status: DC
  Administered 2018-01-26 – 2018-01-28 (×3): 14 mg via TRANSDERMAL
  Filled 2018-01-26 (×3): qty 1

## 2018-01-26 MED ORDER — FENTANYL 12 MCG/HR TD PT72
25.0000 ug | MEDICATED_PATCH | TRANSDERMAL | Status: DC
  Administered 2018-01-26 – 2018-01-29 (×2): 25 ug via TRANSDERMAL
  Filled 2018-01-26: qty 2

## 2018-01-26 MED ORDER — SODIUM CHLORIDE 0.9 % IV SOLN
INTRAVENOUS | Status: DC
  Administered 2018-01-26 (×2): via INTRAVENOUS

## 2018-01-26 MED ORDER — PREGABALIN 50 MG PO CAPS
50.0000 mg | ORAL_CAPSULE | Freq: Three times a day (TID) | ORAL | Status: DC
  Administered 2018-01-26 – 2018-01-29 (×9): 50 mg via ORAL
  Filled 2018-01-26 (×9): qty 1

## 2018-01-26 MED ORDER — ACETAMINOPHEN 325 MG PO TABS: 650.0000 mg | ORAL_TABLET | Freq: Four times a day (QID) | ORAL | Status: DC | PRN

## 2018-01-26 MED ORDER — ONDANSETRON HCL 4 MG/2ML IJ SOLN
4.0000 mg | Freq: Four times a day (QID) | INTRAMUSCULAR | Status: DC | PRN
  Administered 2018-01-26 – 2018-01-28 (×4): 4 mg via INTRAVENOUS
  Filled 2018-01-26 (×4): qty 2

## 2018-01-26 MED ORDER — HYDROCODONE-ACETAMINOPHEN 5-325 MG PO TABS
1.0000 | ORAL_TABLET | ORAL | Status: DC | PRN
  Administered 2018-01-26 – 2018-01-29 (×10): 2 via ORAL
  Filled 2018-01-26 (×10): qty 2

## 2018-01-26 MED ORDER — THIAMINE HCL 100 MG/ML IJ SOLN
100.0000 mg | Freq: Every day | INTRAMUSCULAR | Status: DC
  Administered 2018-01-26: 100 mg via INTRAVENOUS
  Filled 2018-01-26: qty 2

## 2018-01-26 MED ORDER — FENTANYL 12 MCG/HR TD PT72: 25.0000 ug | MEDICATED_PATCH | TRANSDERMAL | Status: DC

## 2018-01-26 MED ORDER — INSULIN ASPART 100 UNIT/ML ~~LOC~~ SOLN
0.0000 [IU] | SUBCUTANEOUS | Status: DC
  Administered 2018-01-26: 2 [IU] via SUBCUTANEOUS
  Administered 2018-01-26: 7 [IU] via SUBCUTANEOUS
  Administered 2018-01-26: 5 [IU] via SUBCUTANEOUS
  Administered 2018-01-27: 2 [IU] via SUBCUTANEOUS
  Administered 2018-01-27: 1 [IU] via SUBCUTANEOUS
  Filled 2018-01-26: qty 1

## 2018-01-26 MED ORDER — ONDANSETRON HCL 4 MG PO TABS
4.0000 mg | ORAL_TABLET | Freq: Four times a day (QID) | ORAL | Status: DC | PRN
  Administered 2018-01-28: 4 mg via ORAL
  Filled 2018-01-26: qty 1

## 2018-01-26 MED ORDER — PANCRELIPASE (LIP-PROT-AMYL) 12000-38000 UNITS PO CPEP
24000.0000 [IU] | ORAL_CAPSULE | Freq: Three times a day (TID) | ORAL | Status: DC
  Administered 2018-01-26 – 2018-01-29 (×8): 24000 [IU] via ORAL
  Filled 2018-01-26 (×10): qty 2

## 2018-01-26 NOTE — Progress Notes (Addendum)
PROGRESS NOTE    Heather Hunt  ZOX:[id number] DOB: Nov 25, 1980 DOA: 01/25/2018 PCP: Patient, No Pcp Per   Brief Narrative: Heather Hunt is a 37 y.o. female with medical history significant of pancreatitis, DM2 on insulin, ETOH abuse. Patient presented with abdominal pain and found to have acute on chronic pancreatitis.   Assessment & Plan:   Active Problems:   Transaminitis   Pancreatitis   Dehydration   Hyperglycemia   Acute on chronic pancreatitis Appears improved from yesterday but not at baseline. Last alcoholic drink three years ago. -Transition to clear liquid diet; NPO after midnight -Continue home fentanyl patch and add home Norco -GI recommendations: ERCP planned for tomorrow -Continue Creon -Continue IV fluids  Elevated AST/ALT/Alkaline phosphatase Concern for stent malfunction. S/p cholecystectomy. -GI recommendations: ERCP as mentioned above  Diabetes mellitus, insulin dependent -Continue Lantus 26 units and SSI  Tobacco abuse Cessation discussed.   DVT prophylaxis: SCDs Code Status:   Code Status: Full Code Family Communication: None Disposition Plan: Discharge pending GI workup   Consultants:   Gastroenterology  Procedures:   None  Antimicrobials:  None    Subjective: Abdominal pain improved. No nausea or vomiting.  Objective: Vitals:   01/26/18 0923 01/26/18 1147 01/26/18 1350 01/26/18 1451  BP: (!) 84/56 109/77 103/73 117/79  Pulse: 77 66 71 73  Resp: 18 16 16 14   Temp: 98.5 F (36.9 C)   98.6 F (37 C)  TempSrc: Oral   Oral  SpO2: 94% 97% 99% 98%  Weight:      Height:        Intake/Output Summary (Last 24 hours) at 01/26/2018 1557 Last data filed at 01/26/2018 1537 Gross per 24 hour  Intake 3603.76 ml  Output -  Net 3603.76 ml   Filed Weights   01/25/18 1601  Weight: 59 kg    Examination:  General exam: Appears calm and comfortable Respiratory system: Clear to auscultation. Respiratory effort  normal. Cardiovascular system: S1 & S2 heard, RRR. No murmurs, rubs, gallops or clicks. Gastrointestinal system: Abdomen is nondistended, soft and nontender. Normal bowel sounds heard. Central nervous system: Alert and oriented. No focal neurological deficits. Extremities: No edema. No calf tenderness Skin: No cyanosis. No rashes Psychiatry: Judgement and insight appear normal. Mood & affect appropriate.     Data Reviewed: I have personally reviewed following labs and imaging studies  CBC: Recent Labs  Lab 01/25/18 1620 01/26/18 0409  WBC 9.2 5.0  NEUTROABS 7.4  --   HGB 12.9 11.7*  HCT 38.5 35.6*  MCV 90.0 90.8  PLT 163 145*   Basic Metabolic Panel: Recent Labs  Lab 01/25/18 1620 01/25/18 2103 01/26/18 0409  NA 133*  --  144  K 3.7  --  3.1*  CL 97*  --  110  CO2 25  --  29  GLUCOSE 373*  --  118*  BUN 8  --  7  CREATININE 0.52  --  0.35*  CALCIUM 8.9  --  8.4*  MG  --  1.7 1.9  PHOS  --  2.7 2.7   GFR: Estimated Creatinine Clearance: 91.4 mL/min (A) (by C-G formula based on SCr of 0.35 mg/dL (L)). Liver Function Tests: Recent Labs  Lab 01/25/18 1620 01/26/18 0409  AST 253* 178*  ALT 197* 154*  ALKPHOS 825* 764*  BILITOT 1.3* 0.9  PROT 7.1 5.8*  ALBUMIN 3.5 2.9*   Recent Labs  Lab 01/25/18 1620  LIPASE 109*   No results for input(s): AMMONIA in the last  168 hours. Coagulation Profile: No results for input(s): INR, PROTIME in the last 168 hours. Cardiac Enzymes: Recent Labs  Lab 01/25/18 1620  CKTOTAL 50   BNP (last 3 results) No results for input(s): PROBNP in the last 8760 hours. HbA1C: No results for input(s): HGBA1C in the last 72 hours. CBG: Recent Labs  Lab 01/25/18 2135 01/26/18 0455 01/26/18 0811 01/26/18 1147  GLUCAP 401* 119* 98 172*   Lipid Profile: No results for input(s): CHOL, HDL, LDLCALC, TRIG, CHOLHDL, LDLDIRECT in the last 72 hours. Thyroid Function Tests: Recent Labs    01/26/18 0450  TSH 2.150   Anemia  Panel: No results for input(s): VITAMINB12, FOLATE, FERRITIN, TIBC, IRON, RETICCTPCT in the last 72 hours. Sepsis Labs: Recent Labs  Lab 01/25/18 1814  LATICACIDVEN 1.10    No results found for this or any previous visit (from the past 240 hour(s)).       Radiology Studies: Ct Abdomen Pelvis W Contrast  Result Date: 01/25/2018 CLINICAL DATA:  Abdominal pain, history of recurrent pancreatitis EXAM: CT ABDOMEN AND PELVIS WITH CONTRAST TECHNIQUE: Multidetector CT imaging of the abdomen and pelvis was performed using the standard protocol following bolus administration of intravenous contrast. CONTRAST:  ISOVUE-300 IOPAMIDOL (ISOVUE-300) INJECTION 61% COMPARISON:  01/13/2018 FINDINGS: Lower chest: Minor dependent atelectasis/hyperventilation. No lower lobe pneumonia. Normal heart size. No pericardial pleural effusion. Hepatobiliary: Stable diffuse biliary dilatation. Remote cholecystectomy noted. Endoscopic pancreatic and common bile duct stents are stable in position. No new focal hepatic abnormality. Pancreas: Similar appearance of diffuse pancreatic edema with decreased parenchymal enhancement. Diffuse coarse pancreatic calcifications. Stable pancreatic ductal dilatation with a proximal pancreatic stent extending into the duodenum. Similar peripancreatic mild strandy edema/fluid. Multifocal pancreatic loculated fluid collections as before, largest in the pancreatic head region is 2.0 cm, previously 2.4 cm. No new fluid collections. Overall stable appearance of acute on chronic pancreatitis. No significant interval change or new finding. Spleen: Normal in size without focal abnormality. Adrenals/Urinary Tract: Normal adrenal glands. No acute renal obstruction, hydronephrosis, hydroureter, or obstructing ureteral calculus. Urinary bladder unremarkable. Left kidney demonstrates a lower pole posterior cortical fat attenuation lesion compatible with an angiomyolipoma measuring 12 mm, image 41  series 2. Stomach/Bowel: Negative for bowel obstruction, significant dilatation, ileus, or free air. Normal appendix demonstrated. Moderate colonic stool burden. No fluid collection or abscess. Overall stable appearance. Vascular/Lymphatic: Aortic atherosclerosis noted without occlusive process or aneurysm. No retroperitoneal hemorrhage. Mesenteric and renal vasculature remain patent. No adenopathy. Stable enlargement of the portal vein and mesenteric veins without portal vein occlusion or thrombus. 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 Reproductive: Uterus normal in size. Right ovary and adnexa unremarkable. Left ovary contains a hypodense cyst measuring 5.1 cm. Other: No abdominal wall hernia or abnormality. No abdominopelvic ascites. 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 Stable pancreatic duct dilatation and diffuse biliary dilatation with unchanged endoscopic common bile duct and pancreatic stents. No new fluid collection or abscess. 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 Portal vein and mesenteric veins remain patent. 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  Shick M.D.   On: 01/25/2018 19:27        Scheduled Meds: . escitalopram  10 mg Oral Daily  .  fentaNYL  25 mcg Transdermal Q72H  . insulin aspart  0-9 Units Subcutaneous Q4H  . insulin glargine  26 Units Subcutaneous Daily  . lipase/protease/amylase  24,000 Units Oral TID  . nicotine  14 mg Transdermal Daily  . pantoprazole  40 mg Oral Daily  . pregabalin  50 mg Oral TID   Continuous Infusions: .  sodium chloride 100 mL/hr at 01/26/18 1537     LOS: 1 day     Jacquelin Hawking, MD [facility] 01/26/2018, 3:57 PM Pager: [phone]  If 7PM-7AM, please contact night-coverage [email or web address] 01/26/2018, 3:57 PM

## 2018-01-26 NOTE — ED Notes (Signed)
RN spoke to [facility] and was informed that pt would soon have a bed placement

## 2018-01-26 NOTE — ED Notes (Signed)
ED TO INPATIENT HANDOFF REPORT  Name/Age/Gender Heather Hunt 37 y.o. female  Code Status    Code Status Orders  (From admission, onward)         Start     Ordered   01/26/18 0709  Full code  Continuous     01/26/18 0708        Code Status History    Date Active Date Inactive Code Status Order ID Comments User Context   01/15/2018 2135 01/17/2018 1634 Full Code [id number]  Etta Quill, DO ED      Home/SNF/Other Home  Chief Complaint ABD pain [place]  Level of Care/Admitting Diagnosis ED Disposition    ED Disposition Condition [place]: [facility] [[id number]]  Level of Care: Med-Surg [16]  Diagnosis: Pancreatitis [[id number]]  Admitting Physician: Toy Baker [[id number]]  Attending Physician: Toy Baker [[id number]]  Estimated length of stay: past midnight tomorrow  Certification:: I certify this patient will need inpatient services for at least 2 midnights  PT Class (Do Not Modify): Inpatient [101]  PT Acc Code (Do Not Modify): Private [1]       Medical History Past Medical History:  Diagnosis Date  . Diabetes mellitus without complication ([place])   . Pancreatitis     Allergies Allergies  Allergen Reactions  . Morphine And Related Hives    IV Location/Drains/Wounds Patient Lines/Drains/Airways Status   Active Line/Drains/Airways    Name:   Placement date:   Placement time:   Site:   Days:   Peripheral IV 01/25/18 Right;Lateral Forearm   01/25/18    2333    Forearm   1          Labs/Imaging Results for orders placed or performed during the hospital encounter of 01/25/18 (from the past 48 hour(s))  Lipase, blood     Status: Abnormal   Collection Time: 01/25/18  4:20 PM  Result Value Ref Range   Lipase 109 (H) 11 - 51 U/L    Comment: Performed at [facility], [place] [address] [place], [place] [address]  Comprehensive metabolic panel     Status: Abnormal   Collection Time: 01/25/18   4:20 PM  Result Value Ref Range   Sodium 133 (L) 135 - 145 mmol/L   Potassium 3.7 3.5 - 5.1 mmol/L   Chloride 97 (L) 98 - 111 mmol/L   CO2 25 22 - 32 mmol/L   Glucose, Bld 373 (H) 70 - 99 mg/dL   BUN 8 6 - 20 mg/dL   Creatinine, Ser 0.52 0.44 - 1.00 mg/dL   Calcium 8.9 8.9 - 10.3 mg/dL   Total Protein 7.1 6.5 - 8.1 g/dL   Albumin 3.5 3.5 - 5.0 g/dL   AST 253 (H) 15 - 41 U/L   ALT 197 (H) 0 - 44 U/L   Alkaline Phosphatase 825 (H) 38 - 126 U/L   Total Bilirubin 1.3 (H) 0.3 - 1.2 mg/dL   GFR calc non Af Amer >60 >60 mL/min   GFR calc Af Amer >60 >60 mL/min    Comment: (NOTE) The eGFR has been calculated using the CKD EPI equation. This calculation has not been validated in all clinical situations. eGFR's persistently <60 mL/min signify possible Chronic Kidney Disease.    Anion gap 11 5 - 15    Comment: Performed at [facility], [place] [address], [place], [place] [address]  CBC     Status: Abnormal   Collection Time: 01/25/18  4:20 PM  Result Value Ref Range   WBC 9.2 4.0 - 10.5 K/uL   RBC 4.28 3.87 - 5.11 MIL/uL   Hemoglobin 12.9 12.0 - 15.0 g/dL   HCT 38.5 36.0 - 46.0 %   MCV 90.0 78.0 - 100.0 fL   MCH 30.1 26.0 - 34.0 pg   MCHC 33.5 30.0 - 36.0 g/dL   RDW 16.5 (H) 11.5 - 15.5 %   Platelets 163 150 - 400 K/uL    Comment: Performed at [facility], [place] [address], [place], [place] [address]  CK     Status: None   Collection Time: 01/25/18  4:20 PM  Result Value Ref Range   Total CK 50 38 - 234 U/L    Comment: Performed at [facility], [place] [address] [place], [place] [address]  Differential     Status: None   Collection Time: 01/25/18  4:20 PM  Result Value Ref Range   Neutrophils Relative % 79 %   Neutro Abs 7.4 1.7 - 7.7 K/uL   Lymphocytes Relative 16 %   Lymphs Abs 1.5 0.7 - 4.0 K/uL   Monocytes Relative 5 %   Monocytes Absolute 0.5 0.1 - 1.0 K/uL   Eosinophils Relative 0 %   Eosinophils Absolute 0.0 0.0 -  0.7 K/uL   Basophils Relative 0 %   Basophils Absolute 0.0 0.0 - 0.1 K/uL    Comment: Performed at [facility], [place] [address], [place], [place] [address]  I-Stat beta hCG blood, ED     Status: None   Collection Time: 01/25/18  4:23 PM  Result Value Ref Range   I-stat hCG, quantitative <5.0 <5 mIU/mL   Comment 3            Comment:   GEST. AGE      CONC.  (mIU/mL)   <=1 WEEK        5 - 50     2 WEEKS       50 - 500     3 WEEKS       100 - 10,000     4 WEEKS     1,000 - 30,000        FEMALE AND NON-PREGNANT FEMALE:     LESS THAN 5 mIU/mL   Urinalysis, Routine w reflex microscopic     Status: Abnormal   Collection Time: 01/25/18  4:49 PM  Result Value Ref Range   Color, Urine YELLOW YELLOW   APPearance CLEAR CLEAR   Specific Gravity, Urine 1.032 (H) 1.005 - 1.030   pH 8.0 5.0 - 8.0   Glucose, UA >=500 (A) NEGATIVE mg/dL   Hgb urine dipstick NEGATIVE NEGATIVE   Bilirubin Urine NEGATIVE NEGATIVE   Ketones, ur NEGATIVE NEGATIVE mg/dL   Protein, ur NEGATIVE NEGATIVE mg/dL   Nitrite NEGATIVE NEGATIVE   Leukocytes, UA NEGATIVE NEGATIVE   RBC / HPF 0-5 0 - 5 RBC/hpf   Bacteria, UA RARE (A) NONE SEEN   Squamous Epithelial / LPF 0-5 0 - 5    Comment: Performed at [facility], [place] [address], [place], [place] [address]  I-Stat CG4 Lactic Acid, ED     Status: None   Collection Time: 01/25/18  6:14 PM  Result Value Ref Range   Lactic Acid, Venous 1.10 0.5 - 1.9 mmol/L  Magnesium     Status: None   Collection Time: 01/25/18  9:03 PM  Result Value Ref Range   Magnesium  1.7 1.7 - 2.4 mg/dL    Comment: Performed at [facility], [place] [address] [place], [place] [address]  Phosphorus     Status: None   Collection Time: 01/25/18  9:03 PM  Result Value Ref Range   Phosphorus 2.7 2.5 - 4.6 mg/dL    Comment: Performed at [facility], [place] [address], [place], [place] [address]  CBG monitoring, ED     Status:  Abnormal   Collection Time: 01/25/18  9:35 PM  Result Value Ref Range   Glucose-Capillary 401 (H) 70 - 99 mg/dL   Comment 1 Document in Chart   Prealbumin     Status: Abnormal   Collection Time: 01/26/18  4:09 AM  Result Value Ref Range   Prealbumin 11.0 (L) 18 - 38 mg/dL    Comment: Performed at [facility], [place] [address] [place], [place] [address]  Acetaminophen level     Status: Abnormal   Collection Time: 01/26/18  4:09 AM  Result Value Ref Range   Acetaminophen (Tylenol), Serum <10 (L) 10 - 30 ug/mL    Comment: (NOTE) Therapeutic concentrations vary significantly. A range of 10-30 ug/mL  may be an effective concentration for many patients. However, some  are best treated at concentrations outside of this range. Acetaminophen concentrations >150 ug/mL at 4 hours after ingestion  and >50 ug/mL at 12 hours after ingestion are often associated with  toxic reactions. Performed at [facility], [place] [address] [place], [place] [address]   Magnesium     Status: None   Collection Time: 01/26/18  4:09 AM  Result Value Ref Range   Magnesium 1.9 1.7 - 2.4 mg/dL    Comment: Performed at [facility], [place] [address], [place], [place] [address]  Phosphorus     Status: None   Collection Time: 01/26/18  4:09 AM  Result Value Ref Range   Phosphorus 2.7 2.5 - 4.6 mg/dL    Comment: Performed at [facility], [place] [address] [place], [place] [address]  Comprehensive metabolic panel     Status: Abnormal   Collection Time: 01/26/18  4:09 AM  Result Value Ref Range   Sodium 144 135 - 145 mmol/L    Comment: DELTA CHECK NOTED   Potassium 3.1 (L) 3.5 - 5.1 mmol/L   Chloride 110 98 - 111 mmol/L   CO2 29 22 - 32 mmol/L   Glucose, Bld 118 (H) 70 - 99 mg/dL   BUN 7 6 - 20 mg/dL   Creatinine, Ser 0.35 (L) 0.44 - 1.00 mg/dL   Calcium 8.4 (L) 8.9 - 10.3 mg/dL   Total Protein 5.8 (L) 6.5 - 8.1 g/dL   Albumin 2.9 (L) 3.5  - 5.0 g/dL   AST 178 (H) 15 - 41 U/L   ALT 154 (H) 0 - 44 U/L   Alkaline Phosphatase 764 (H) 38 - 126 U/L   Total Bilirubin 0.9 0.3 - 1.2 mg/dL   GFR calc non Af Amer >60 >60 mL/min   GFR calc Af Amer >60 >60 mL/min    Comment: (NOTE) The eGFR has been calculated using the CKD EPI equation. This calculation has not been validated in all clinical situations. eGFR's persistently <60 mL/min signify possible Chronic Kidney Disease.    Anion gap 5 5 - 15    Comment: Performed at [facility], [place] [address] [place], [place] [address]  CBC     Status: Abnormal  Collection Time: 01/26/18  4:09 AM  Result Value Ref Range   WBC 5.0 4.0 - 10.5 K/uL   RBC 3.92 3.87 - 5.11 MIL/uL   Hemoglobin 11.7 (L) 12.0 - 15.0 g/dL   HCT 35.6 (L) 36.0 - 46.0 %   MCV 90.8 78.0 - 100.0 fL   MCH 29.8 26.0 - 34.0 pg   MCHC 32.9 30.0 - 36.0 g/dL   RDW 16.8 (H) 11.5 - 15.5 %   Platelets 145 (L) 150 - 400 K/uL    Comment: Performed at [facility], [place] [address] [place], [place] [address]  TSH     Status: None   Collection Time: 01/26/18  4:50 AM  Result Value Ref Range   TSH 2.150 0.350 - 4.500 uIU/mL    Comment: Performed by a 3rd Generation assay with a functional sensitivity of <=0.01 uIU/mL. Performed at [facility], [place] [address], [place], [place] [address]   CBG monitoring, ED     Status: Abnormal   Collection Time: 01/26/18  4:55 AM  Result Value Ref Range   Glucose-Capillary 119 (H) 70 - 99 mg/dL  CBG monitoring, ED     Status: None   Collection Time: 01/26/18  8:11 AM  Result Value Ref Range   Glucose-Capillary 98 70 - 99 mg/dL  CBG monitoring, ED     Status: Abnormal   Collection Time: 01/26/18 11:47 AM  Result Value Ref Range   Glucose-Capillary 172 (H) 70 - 99 mg/dL   Comment 1 Document in Chart    Comment 2 Repeat Test    Ct Abdomen Pelvis W Contrast  Result Date: 01/25/2018 CLINICAL DATA:  Abdominal pain, history of  recurrent pancreatitis EXAM: CT ABDOMEN AND PELVIS WITH CONTRAST TECHNIQUE: Multidetector CT imaging of the abdomen and pelvis was performed using the standard protocol following bolus administration of intravenous contrast. CONTRAST:  136m ISOVUE-300 IOPAMIDOL (ISOVUE-300) INJECTION 61% COMPARISON:  01/13/2018 FINDINGS: Lower chest: Minor dependent atelectasis/hyperventilation. No lower lobe pneumonia. Normal heart size. No pericardial pleural effusion. Hepatobiliary: Stable diffuse biliary dilatation. Remote cholecystectomy noted. Endoscopic pancreatic and common bile duct stents are stable in position. No new focal hepatic abnormality. Pancreas: Similar appearance of diffuse pancreatic edema with decreased parenchymal enhancement. Diffuse coarse pancreatic calcifications. Stable pancreatic ductal dilatation with a proximal pancreatic stent extending into the duodenum. Similar peripancreatic mild strandy edema/fluid. Multifocal pancreatic loculated fluid collections as before, largest in the pancreatic head region is 2.0 cm, previously 2.4 cm. No new fluid collections. Overall stable appearance of acute on chronic pancreatitis. No significant interval change or new finding. Spleen: Normal in size without focal abnormality. Adrenals/Urinary Tract: Normal adrenal glands. No acute renal obstruction, hydronephrosis, hydroureter, or obstructing ureteral calculus. Urinary bladder unremarkable. 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 Stomach/Bowel: Negative for bowel obstruction, significant dilatation, ileus, or free air. Normal appendix demonstrated. Moderate colonic stool burden. No fluid collection or abscess. Overall stable appearance. Vascular/Lymphatic: Aortic atherosclerosis noted without occlusive process or aneurysm. No retroperitoneal hemorrhage. Mesenteric and renal vasculature remain patent. No  adenopathy. Stable enlargement of the portal vein and mesenteric veins without portal vein occlusion or thrombus. 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 Reproductive: Uterus normal in size. Right ovary and adnexa unremarkable. Left ovary contains a hypodense cyst measuring 5.1 cm. Other: No abdominal  wall hernia or abnormality. No abdominopelvic ascites. 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 Stable pancreatic duct dilatation and diffuse biliary dilatation with unchanged endoscopic common bile duct and pancreatic stents. No new fluid collection or abscess. 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 Portal vein and mesenteric veins remain patent. 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  Shick M.D.   On: 01/25/2018 19:27    Pending Labs Unresulted Labs (From admission, onward)    Start     Ordered   01/26/18 0500  Hemoglobin A1c  Tomorrow morning,   R    Comments:  To assess prior glycemic control    01/26/18 0447   01/25/18 2303  Hepatitis panel, acute  Tomorrow morning,   R     01/25/18 2303          Vitals/Pain Today's Vitals   01/26/18 1147 01/26/18 1157 01/26/18 1200 01/26/18 1350  BP: 109/77   103/73  Pulse: 66   71  Resp: 16   16  Temp:      TempSrc:      SpO2: 97%   99%  Weight:      Height:      PainSc:  8  8      Isolation Precautions No active isolations  Medications Medications  iopamidol (ISOVUE-300) 61 % injection (has no administration in time range)  nicotine (NICODERM CQ - dosed in mg/24 hours) patch 14 mg (14 mg Transdermal  Patch Applied 01/26/18 1014)  insulin aspart (novoLOG) injection 0-9 Units (2 Units Subcutaneous Given 01/26/18 1159)  escitalopram (LEXAPRO) tablet 10 mg (10 mg Oral Given 01/26/18 1009)  lipase/protease/amylase (CREON) capsule 24,000 Units (24,000 Units Oral Given 01/26/18 1010)  pantoprazole (PROTONIX) EC tablet 40 mg (40 mg Oral Given 01/26/18 1009)  pregabalin (LYRICA) capsule 50 mg (50 mg Oral Given 01/26/18 1009)  acetaminophen (TYLENOL) tablet 650 mg (has no administration in time range)    Or  acetaminophen (TYLENOL) suppository 650 mg (has no administration in time range)  ondansetron (ZOFRAN) tablet 4 mg (has no administration in time range)    Or  ondansetron (ZOFRAN) injection 4 mg (has no administration in time range)  insulin glargine (LANTUS) injection 26 Units (26 Units Subcutaneous Given 01/26/18 1011)  fentaNYL (DURAGESIC - dosed mcg/hr) 25 mcg (25 mcg Transdermal Patch Applied 01/26/18 1019)  0.9 %  sodium chloride infusion (has no administration in time range)  HYDROcodone-acetaminophen (NORCO/VICODIN) 5-325 MG per tablet 1-2 tablet (has no administration in time range)  HYDROmorphone (DILAUDID) injection 1 mg (1 mg Intravenous Given 01/25/18 1808)  sodium chloride 0.9 % bolus 1,000 mL (0 mLs Intravenous Stopped 01/25/18 1915)  metoCLOPramide (REGLAN) injection 10 mg (10 mg Intravenous Given 01/25/18 1809)  iopamidol (ISOVUE-300) 61 % injection 100 mL (100 mLs Intravenous Contrast Given 01/25/18 1822)  ondansetron (ZOFRAN) injection 4 mg (4 mg Intravenous Given 01/25/18 2036)  insulin aspart (novoLOG) injection 10 Units (10 Units Subcutaneous Given 01/25/18 2155)  sodium chloride 0.9 % bolus 1,000 mL (0 mLs Intravenous Stopped 01/25/18 2331)    Mobility walks

## 2018-01-26 NOTE — ED Notes (Signed)
Notified pharmacy to verify prescribed admission medication.

## 2018-01-26 NOTE — ED Notes (Signed)
Pt given a clear liquid diet tray

## 2018-01-26 NOTE — Progress Notes (Signed)
Paged Dr.Nettey, patient crying in bed complaining of a "8" on the pain scale. Norco 10mg  given at 1530 and PRN every 6 hours. Waiting on return call or new orders.

## 2018-01-26 NOTE — ED Notes (Signed)
RN ordered pt a liquid diet tray

## 2018-01-26 NOTE — Consult Note (Addendum)
[facility] Consult  Referring Provider: Toy Baker, MD Primary Care Physician:  Patient, No Pcp Per Primary Gastroenterologist: Althia Forts  Reason for Consultation:  Pancreatitis, abnormal LFTs,biliary and pancreatic stent in place  HPI: Heather Hunt is a 37 y.o. female presented to the ER with 2 days of upper abdominal pain associated with nausea, without fever/chills/rigors/yellowish discoloration of skin or urine/stool.  Patient known to have recurrent episodes of acute on chronic pancreatitis attributed to heavy alcohol use(daily consumption of vodka for over 2 years, last alcohol use 3 years ago as per patient) for the last 5-6 years, requiring hospitalization in [place], [place], [place] and most recently in [place] in March 2019.  She had an ERCP with plastic CBD stent placement and pancreatic duct dilation, pancreatic sphincterotomy and pancreatic stent placement with the plan to repeat ERCP in 10-12 weeks. Patient states that on the day of repeat ERCP scheduled at [place] she had to be admitted in [facility] and could not get her stents removed.  The patient reports having splanchnic nerve ablation performed in [place] and taking oxycodone and fentanyl from Dr. Bruce Donath in [facility] for pain management. She is also on Creon 3 times a day.  The patient is known to have multiple pancreatic pseudocysts along with intra-and extrahepatic biliary dilatation and a long segment distal CBD stricture with mild dilatation of the pancreatic duct.  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 She denies blood in stool or black stools. She denies significant or unintentional weight loss.  The patient had a feeding jejunostomy placed at one point for failure to thrive and malnutrition which was removed after 2 months of placement.   Past Medical  History:  Diagnosis Date  . Diabetes mellitus without complication ([place])   . Pancreatitis     Past Surgical History:  Procedure Laterality Date  . BILE DUCT STENT PLACEMENT    . KNEE SURGERY    . pancreas stent    . SYMPATHECTOMY      Prior to Admission medications   Medication Sig Start Date End Date Taking? Authorizing Provider  escitalopram (LEXAPRO) 10 MG tablet Take 10 mg by mouth daily.    Yes [provider]  fentaNYL (DURAGESIC - DOSED MCG/HR) 12 MCG/HR Place 25 mcg onto the skin every 3 (three) days.    Yes [provider]  insulin degludec (TRESIBA FLEXTOUCH) 100 UNIT/ML SOPN FlexTouch Pen Inject 0.26 mLs (26 Units total) into the skin daily. 01/17/18  Yes Hall, Carole N, DO  insulin regular (HUMULIN R) 100 units/mL injection Inject 0.01-0.1 mLs (1-10 Units total) into the skin 3 (three) times daily before meals. Sliding scale 01/17/18  Yes Kayleen Memos, DO  lipase/protease/amylase (CREON) 12000 units CPEP capsule Take 2 capsules (24,000 Units total) by mouth 3 (three) times daily. 01/17/18  Yes Irene Pap N, DO  ondansetron (ZOFRAN ODT) 4 MG disintegrating tablet '4mg'$  ODT q4 hours prn nausea/vomit 01/13/18  Yes Milton [place], MD  pantoprazole (PROTONIX) 40 MG tablet Take 40 mg by mouth daily. 10/12/17  Yes [provider]  potassium chloride (K-DUR) 10 MEQ tablet Take 1 tablet (10 mEq total) by mouth daily. 01/13/18  Yes Milton [place], MD  pregabalin (LYRICA) 50 MG capsule Take 50 mg by mouth 3 (three) times daily.   Yes [provider]  promethazine (PHENERGAN) 12.5 MG tablet Take 12.5 mg by mouth every 6 (six) hours as needed for nausea  or vomiting.  11/11/17  Yes [provider]  blood glucose meter kit and supplies KIT Dispense based on patient and insurance preference. Use up to four times daily as directed. (FOR ICD-9 250.00, 250.01). 12/07/17   Waynetta Pean, PA-C  HYDROcodone-acetaminophen (NORCO/VICODIN) 5-325 MG tablet Take 1  tablet by mouth every 6 (six) hours as needed for moderate pain. Patient not taking: Reported on 01/25/2018 01/13/18   Milton [place], MD  nicotine (NICODERM CQ - DOSED IN MG/24 HOURS) 14 mg/24hr patch Place 1 patch (14 mg total) onto the skin daily. Patient not taking: Reported on 01/25/2018 01/18/18   Kayleen Memos, DO    Current Facility-Administered Medications  Medication Dose Route Frequency Provider Last Rate Last Dose  . 0.9 %  sodium chloride infusion   Intravenous Continuous Mariel Aloe, MD      . acetaminophen (TYLENOL) tablet 650 mg  650 mg Oral Q6H PRN Toy Baker, MD       Or  . acetaminophen (TYLENOL) suppository 650 mg  650 mg Rectal Q6H PRN Doutova, Anastassia, MD      . escitalopram (LEXAPRO) tablet 10 mg  10 mg Oral Daily Doutova, Anastassia, MD   10 mg at 01/26/18 1009  . fentaNYL (DURAGESIC - dosed mcg/hr) 25 mcg  25 mcg Transdermal Q72H Mariel Aloe, MD   25 mcg at 01/26/18 1019  . HYDROcodone-acetaminophen (NORCO/VICODIN) 5-325 MG per tablet 1-2 tablet  1-2 tablet Oral Q6H PRN Mariel Aloe, MD      . insulin aspart (novoLOG) injection 0-9 Units  0-9 Units Subcutaneous Q4H Toy Baker, MD   2 Units at 01/26/18 1159  . insulin glargine (LANTUS) injection 26 Units  26 Units Subcutaneous Daily Mariel Aloe, MD   26 Units at 01/26/18 1011  . lipase/protease/amylase (CREON) capsule 24,000 Units  24,000 Units Oral TID Toy Baker, MD   24,000 Units at 01/26/18 1010  . nicotine (NICODERM CQ - dosed in mg/24 hours) patch 14 mg  14 mg Transdermal Daily Doutova, Anastassia, MD   14 mg at 01/26/18 1014  . ondansetron (ZOFRAN) tablet 4 mg  4 mg Oral Q6H PRN Doutova, Anastassia, MD       Or  . ondansetron (ZOFRAN) injection 4 mg  4 mg Intravenous Q6H PRN Doutova, Anastassia, MD      . pantoprazole (PROTONIX) EC tablet 40 mg  40 mg Oral Daily Doutova, Anastassia, MD   40 mg at 01/26/18 1009  . pregabalin (LYRICA) capsule 50 mg  50 mg Oral TID Toy Baker, MD   50 mg at 01/26/18 1009   Current Outpatient Medications  Medication Sig Dispense Refill  . escitalopram (LEXAPRO) 10 MG tablet Take 10 mg by mouth daily.     . fentaNYL (DURAGESIC - DOSED MCG/HR) 12 MCG/HR Place 25 mcg onto the skin every 3 (three) days.     . insulin degludec (TRESIBA FLEXTOUCH) 100 UNIT/ML SOPN FlexTouch Pen Inject 0.26 mLs (26 Units total) into the skin daily. 1 pen 0  . insulin regular (HUMULIN R) 100 units/mL injection Inject 0.01-0.1 mLs (1-10 Units total) into the skin 3 (three) times daily before meals. Sliding scale 20 mL 0  . lipase/protease/amylase (CREON) 12000 units CPEP capsule Take 2 capsules (24,000 Units total) by mouth 3 (three) times daily. 30 capsule 0  . ondansetron (ZOFRAN ODT) 4 MG disintegrating tablet '4mg'$  ODT q4 hours prn nausea/vomit 12 tablet 0  . pantoprazole (PROTONIX) 40 MG tablet Take 40 mg by mouth  daily.  0  . potassium chloride (K-DUR) 10 MEQ tablet Take 1 tablet (10 mEq total) by mouth daily. 10 tablet 0  . pregabalin (LYRICA) 50 MG capsule Take 50 mg by mouth 3 (three) times daily.    . promethazine (PHENERGAN) 12.5 MG tablet Take 12.5 mg by mouth every 6 (six) hours as needed for nausea or vomiting.   0  . blood glucose meter kit and supplies KIT Dispense based on patient and insurance preference. Use up to four times daily as directed. (FOR ICD-9 250.00, 250.01). 1 each 0  . HYDROcodone-acetaminophen (NORCO/VICODIN) 5-325 MG tablet Take 1 tablet by mouth every 6 (six) hours as needed for moderate pain. (Patient not taking: Reported on 01/25/2018) 20 tablet 0  . nicotine (NICODERM CQ - DOSED IN MG/24 HOURS) 14 mg/24hr patch Place 1 patch (14 mg total) onto the skin daily. (Patient not taking: Reported on 01/25/2018) 28 patch 0    Allergies as of 01/25/2018 - Review Complete 01/25/2018  Allergen Reaction Noted  . Morphine and related Hives 11/19/2017    Family History  Problem Relation Age of Onset  . Cancer Father   .  Lung cancer Father   . Diabetes Neg Hx   . Stroke Neg Hx   . CAD Neg Hx     Social History   Socioeconomic History  . Marital status: Married    Spouse name: Not on file  . Number of children: Not on file  . Years of education: Not on file  . Highest education level: Not on file  Occupational History  . Not on file  Social Needs  . Financial resource strain: Not on file  . Food insecurity:    Worry: Not on file    Inability: Not on file  . Transportation needs:    Medical: Not on file    Non-medical: Not on file  Tobacco Use  . Smoking status: Current Every Day Smoker    Packs/day: 0.50    Types: Cigarettes  . Smokeless tobacco: Never Used  Substance and Sexual Activity  . Alcohol use: Not Currently  . Drug use: Never  . Sexual activity: Not on file  Lifestyle  . Physical activity:    Days per week: Not on file    Minutes per session: Not on file  . Stress: Not on file  Relationships  . Social connections:    Talks on phone: Not on file    Gets together: Not on file    Attends religious service: Not on file    Active member of club or organization: Not on file    Attends meetings of clubs or organizations: Not on file    Relationship status: Not on file  . Intimate partner violence:    Fear of current or ex partner: Not on file    Emotionally abused: Not on file    Physically abused: Not on file    Forced sexual activity: Not on file  Other Topics Concern  . Not on file  Social History Narrative  . Not on file    Review of Systems:  GI: Described in detail in HPI.    Gen: Denies any fever, chills, rigors, night sweats, anorexia, fatigue, weakness, malaise, involuntary weight loss, and sleep disorder CV: Denies chest pain, angina, palpitations, syncope, orthopnea, PND, peripheral edema, and claudication. Resp: Denies dyspnea, cough, sputum, wheezing, coughing up blood. GU : Denies urinary burning, blood in urine, urinary frequency, urinary hesitancy,  nocturnal urination, and urinary  incontinence. MS: Denies joint pain or swelling.  Denies muscle weakness, cramps, atrophy.  Derm: Denies rash, itching, oral ulcerations, hives, unhealing ulcers.  Psych: Denies depression, anxiety, memory loss, suicidal ideation, hallucinations,  and confusion. Heme: Denies bruising, bleeding, and enlarged lymph nodes. Neuro:  Denies any headaches, dizziness, paresthesias. Endo:   DM,Denies any problems with thyroid, adrenal function.  Physical Exam: Vital signs in last 24 hours: Temp:  [98.5 F (36.9 C)-98.6 F (37 C)] 98.5 F (36.9 C) (10/02 0923) Pulse Rate:  [66-101] 66 (10/02 1147) Resp:  [12-18] 16 (10/02 1147) BP: (84-118)/(56-78) 109/77 (10/02 1147) SpO2:  [93 %-99 %] 97 % (10/02 1147) Weight:  [59 kg] 59 kg (10/01 1601)    General:   Alert,  Well-developed, well-nourished, pleasant and cooperative in NAD Head:  Normocephalic and atraumatic. Eyes:  Sclera clear, no icterus.   Mild pallor Ears:  Normal auditory acuity. Nose:  No deformity, discharge,  or lesions. Mouth:  No deformity or lesions.  Oropharynx pink & moist. Neck:  Supple; no masses or thyromegaly. Lungs:  Clear throughout to auscultation.   No wheezes, crackles, or rhonchi. No acute distress. Heart:  Regular rate and rhythm; no murmurs, clicks, rubs,  or gallops. Extremities:  Without clubbing or edema. Neurologic:  Alert and  oriented x4;  grossly normal neurologically. Skin:  Intact without significant lesions or rashes. Psych:  Alert and cooperative. Normal mood and affect. Abdomen:  Soft, epigastric tenderness and nondistended. No masses, hepatosplenomegaly or hernias noted. Normal bowel sounds, without guarding, and without rebound.         Lab Results: Recent Labs    01/25/18 1620 01/26/18 0409  WBC 9.2 5.0  HGB 12.9 11.7*  HCT 38.5 35.6*  PLT 163 145*   BMET Recent Labs    01/25/18 1620 01/26/18 0409  NA 133* 144  K 3.7 3.1*  CL 97* 110  CO2 25 29   GLUCOSE 373* 118*  BUN 8 7  CREATININE 0.52 0.35*  CALCIUM 8.9 8.4*   LFT Recent Labs    01/26/18 0409  PROT 5.8*  ALBUMIN 2.9*  AST 178*  ALT 154*  ALKPHOS 764*  BILITOT 0.9   PT/INR No results for input(s): LABPROT, INR in the last 72 hours.  Studies/Results: Ct Abdomen Pelvis W Contrast  Result Date: 01/25/2018 CLINICAL DATA:  Abdominal pain, history of recurrent pancreatitis EXAM: CT ABDOMEN AND PELVIS WITH CONTRAST TECHNIQUE: Multidetector CT imaging of the abdomen and pelvis was performed using the standard protocol following bolus administration of intravenous contrast. CONTRAST:  151m ISOVUE-300 IOPAMIDOL (ISOVUE-300) INJECTION 61% COMPARISON:  01/13/2018 FINDINGS: Lower chest: Minor dependent atelectasis/hyperventilation. No lower lobe pneumonia. Normal heart size. No pericardial pleural effusion. Hepatobiliary: Stable diffuse biliary dilatation. Remote cholecystectomy noted. Endoscopic pancreatic and common bile duct stents are stable in position. No new focal hepatic abnormality. Pancreas: Similar appearance of diffuse pancreatic edema with decreased parenchymal enhancement. Diffuse coarse pancreatic calcifications. Stable pancreatic ductal dilatation with a proximal pancreatic stent extending into the duodenum. Similar peripancreatic mild strandy edema/fluid. Multifocal pancreatic loculated fluid collections as before, largest in the pancreatic head region is 2.0 cm, previously 2.4 cm. No new fluid collections. Overall stable appearance of acute on chronic pancreatitis. No significant interval change or new finding. Spleen: Normal in size without focal abnormality. Adrenals/Urinary Tract: Normal adrenal glands. No acute renal obstruction, hydronephrosis, hydroureter, or obstructing ureteral calculus. Urinary bladder unremarkable. 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  Stomach/Bowel: Negative for bowel obstruction, significant dilatation, ileus, or free air. Normal appendix demonstrated. Moderate colonic stool burden. No fluid collection or abscess. Overall stable appearance. Vascular/Lymphatic: Aortic atherosclerosis noted without occlusive process or aneurysm. No retroperitoneal hemorrhage. Mesenteric and renal vasculature remain patent. No adenopathy. Stable enlargement of the portal vein and mesenteric veins without portal vein occlusion or thrombus. 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 Reproductive: Uterus normal in size. Right ovary and adnexa unremarkable. Left ovary contains a hypodense cyst measuring 5.1 cm. Other: No abdominal wall hernia or abnormality. No abdominopelvic ascites. 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 Stable pancreatic duct dilatation and diffuse biliary dilatation with unchanged endoscopic common bile duct and pancreatic stents. No new fluid collection or abscess. 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 Portal vein and mesenteric veins remain patent. 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  Shick M.D.   On: 01/25/2018 19:27    Impression: 1.Stable acute on chronic pancreatitis small loculated pancreatic collection, chronic pseudocysts Lipase 109 on admission 2.Stable pancreatic ductal dilatation 3.Diffuse biliary dilatation with pancreatic and CBD stents in place 4.Chronic distal splenic vein thrombosis Abnormal liver enzymes T bili 0.9/AST 178/AST 154/ALP 764  Plan: Clear liquid diet, continue  normal saline at 100 mL an hour, has received 1 L fluid bolus, pain management-currently on fentanyl patch, Dilaudid IV 1 dose, hydrocodone as needed, continue Creon 24,000 units 3 times a day with meals.  Patient will benefit from ERCP for removal of stent-could be clogged as reflected by abnormal LFTs,may need a new stent depending on cholangiogram if an obvious CBD stricture is noted.  Timing of ERCP to be decided depending upon anesthesia availability.  The risks and benefits of the procedure were discussed with the patient in details. She understands and verbalizes consent.   LOS: 1 day   Ronnette Juniper, M.D. 01/26/2018, 1:04 PM  Pager [phone] If no answer or after 5 PM call [phone]

## 2018-01-27 ENCOUNTER — Inpatient Hospital Stay (HOSPITAL_COMMUNITY): Payer: BLUE CROSS/BLUE SHIELD | Admitting: Certified Registered"

## 2018-01-27 ENCOUNTER — Encounter (HOSPITAL_COMMUNITY)
Admission: EM | Disposition: A | Discharge status: Home / Self Care | Payer: Self-pay | Source: Home / Self Care | Attending: Family Medicine

## 2018-01-27 ENCOUNTER — Inpatient Hospital Stay (HOSPITAL_COMMUNITY): Payer: BLUE CROSS/BLUE SHIELD

## 2018-01-27 ENCOUNTER — Encounter (HOSPITAL_COMMUNITY): Payer: Self-pay | Admitting: Certified Registered"

## 2018-01-27 HISTORY — PX: ERCP: SHX5425

## 2018-01-27 HISTORY — PX: BILIARY STENT PLACEMENT: SHX5538

## 2018-01-27 HISTORY — PX: STENT REMOVAL: SHX6421

## 2018-01-27 LAB — HEPATITIS PANEL, ACUTE
HEP B S AG: NEGATIVE
Hep A IgM: NEGATIVE
Hep B C IgM: NEGATIVE

## 2018-01-27 LAB — CBC
HEMATOCRIT: 36.9 % (ref 36.0–46.0)
Hemoglobin: 11.8 g/dL — ABNORMAL LOW (ref 12.0–15.0)
MCH: 29.2 pg (ref 26.0–34.0)
MCHC: 32 g/dL (ref 30.0–36.0)
MCV: 91.3 fL (ref 78.0–100.0)
Platelets: 139 10*3/uL — ABNORMAL LOW (ref 150–400)
RBC: 4.04 MIL/uL (ref 3.87–5.11)
RDW: 17 % — AB (ref 11.5–15.5)
WBC: 5 10*3/uL (ref 4.0–10.5)

## 2018-01-27 LAB — COMPREHENSIVE METABOLIC PANEL
ALBUMIN: 2.5 g/dL — AB (ref 3.5–5.0)
ALT: 105 U/L — ABNORMAL HIGH (ref 0–44)
ANION GAP: 5 (ref 5–15)
AST: 76 U/L — ABNORMAL HIGH (ref 15–41)
Alkaline Phosphatase: 650 U/L — ABNORMAL HIGH (ref 38–126)
BILIRUBIN TOTAL: 0.8 mg/dL (ref 0.3–1.2)
BUN: <5 mg/dL — ABNORMAL LOW (ref 6–20)
CO2: 24 mmol/L (ref 22–32)
Calcium: 8.2 mg/dL — ABNORMAL LOW (ref 8.9–10.3)
Chloride: 113 mmol/L — ABNORMAL HIGH (ref 98–111)
Creatinine, Ser: 0.32 mg/dL — ABNORMAL LOW (ref 0.44–1.00)
GFR calc Af Amer: >60 mL/min (ref >60)
Glucose, Bld: 67 mg/dL — ABNORMAL LOW (ref 70–99)
POTASSIUM: 3.5 mmol/L (ref 3.5–5.1)
Sodium: 142 mmol/L (ref 135–145)
TOTAL PROTEIN: 5.3 g/dL — AB (ref 6.5–8.1)

## 2018-01-27 LAB — GLUCOSE, CAPILLARY
GLUCOSE-CAPILLARY: 65 mg/dL — AB (ref 70–99)
GLUCOSE-CAPILLARY: 89 mg/dL (ref 70–99)
GLUCOSE-CAPILLARY: 132 mg/dL — AB (ref 70–99)
Glucose-Capillary: 125 mg/dL — ABNORMAL HIGH (ref 70–99)
Glucose-Capillary: 152 mg/dL — ABNORMAL HIGH (ref 70–99)
Glucose-Capillary: 162 mg/dL — ABNORMAL HIGH (ref 70–99)
Glucose-Capillary: 89 mg/dL (ref 70–99)

## 2018-01-27 LAB — HEMOGLOBIN A1C
Hgb A1c MFr Bld: 13.4 % — ABNORMAL HIGH (ref 4.8–5.6)
Mean Plasma Glucose: 338 mg/dL

## 2018-01-27 SURGERY — ERCP, WITH INTERVENTION IF INDICATED
Anesthesia: General

## 2018-01-27 MED ORDER — SUGAMMADEX SODIUM 200 MG/2ML IV SOLN
INTRAVENOUS | Status: DC | PRN
  Administered 2018-01-27: 200 mg via INTRAVENOUS

## 2018-01-27 MED ORDER — INDOMETHACIN 50 MG RE SUPP
RECTAL | Status: AC
  Filled 2018-01-27: qty 2

## 2018-01-27 MED ORDER — INSULIN ASPART 100 UNIT/ML ~~LOC~~ SOLN
0.0000 [IU] | Freq: Three times a day (TID) | SUBCUTANEOUS | Status: DC
  Administered 2018-01-28: 2 [IU] via SUBCUTANEOUS
  Administered 2018-01-28: 3 [IU] via SUBCUTANEOUS
  Administered 2018-01-29: 5 [IU] via SUBCUTANEOUS

## 2018-01-27 MED ORDER — FENTANYL CITRATE (PF) 250 MCG/5ML IJ SOLN
INTRAMUSCULAR | Status: DC | PRN
  Administered 2018-01-27: 50 ug via INTRAVENOUS

## 2018-01-27 MED ORDER — FENTANYL CITRATE (PF) 100 MCG/2ML IJ SOLN
50.0000 ug | Freq: Once | INTRAMUSCULAR | Status: AC
  Administered 2018-01-27: 50 ug via INTRAVENOUS
  Filled 2018-01-27: qty 2

## 2018-01-27 MED ORDER — DIPHENHYDRAMINE HCL 50 MG/ML IJ SOLN
INTRAMUSCULAR | Status: DC | PRN
  Administered 2018-01-27: 12.5 mg via INTRAVENOUS

## 2018-01-27 MED ORDER — LACTATED RINGERS IV SOLN
INTRAVENOUS | Status: DC
  Administered 2018-01-27: 11:00:00 via INTRAVENOUS

## 2018-01-27 MED ORDER — PHENYLEPHRINE HCL 10 MG/ML IJ SOLN
INTRAMUSCULAR | Status: DC | PRN
  Administered 2018-01-27: 80 ug via INTRAVENOUS

## 2018-01-27 MED ORDER — INSULIN GLARGINE 100 UNIT/ML ~~LOC~~ SOLN
10.0000 [IU] | Freq: Once | SUBCUTANEOUS | Status: AC
  Administered 2018-01-27: 10 [IU] via SUBCUTANEOUS
  Filled 2018-01-27: qty 0.1

## 2018-01-27 MED ORDER — CIPROFLOXACIN IN D5W 400 MG/200ML IV SOLN
INTRAVENOUS | Status: AC
  Filled 2018-01-27: qty 200

## 2018-01-27 MED ORDER — SODIUM CHLORIDE 0.9 % IV SOLN
INTRAVENOUS | Status: DC | PRN
  Administered 2018-01-27: 40 mL

## 2018-01-27 MED ORDER — PROPOFOL 10 MG/ML IV BOLUS
INTRAVENOUS | Status: AC
  Filled 2018-01-27: qty 20

## 2018-01-27 MED ORDER — DEXTROSE 50 % IV SOLN
INTRAVENOUS | Status: AC
  Administered 2018-01-27: 25 mL
  Filled 2018-01-27: qty 50

## 2018-01-27 MED ORDER — ONDANSETRON HCL 4 MG/2ML IJ SOLN
INTRAMUSCULAR | Status: AC
  Filled 2018-01-27: qty 2

## 2018-01-27 MED ORDER — LIDOCAINE 2% (20 MG/ML) 5 ML SYRINGE
INTRAMUSCULAR | Status: DC | PRN
  Administered 2018-01-27: 60 mg via INTRAVENOUS

## 2018-01-27 MED ORDER — PROPOFOL 10 MG/ML IV BOLUS
INTRAVENOUS | Status: DC | PRN
  Administered 2018-01-27: 120 mg via INTRAVENOUS

## 2018-01-27 MED ORDER — FENTANYL CITRATE (PF) 250 MCG/5ML IJ SOLN
INTRAMUSCULAR | Status: AC
  Filled 2018-01-27: qty 5

## 2018-01-27 MED ORDER — CIPROFLOXACIN IN D5W 400 MG/200ML IV SOLN
INTRAVENOUS | Status: DC | PRN
  Administered 2018-01-27: 400 mg via INTRAVENOUS

## 2018-01-27 MED ORDER — ENSURE ENLIVE PO LIQD
237.0000 mL | Freq: Two times a day (BID) | ORAL | Status: DC
  Administered 2018-01-28: 237 mL via ORAL
  Filled 2018-01-27 (×4): qty 237

## 2018-01-27 MED ORDER — DEXTROSE-NACL 5-0.45 % IV SOLN
INTRAVENOUS | Status: AC
  Administered 2018-01-27: 20:00:00 via INTRAVENOUS

## 2018-01-27 MED ORDER — PHENYLEPHRINE 40 MCG/ML (10ML) SYRINGE FOR IV PUSH (FOR BLOOD PRESSURE SUPPORT)
PREFILLED_SYRINGE | INTRAVENOUS | Status: DC | PRN
  Administered 2018-01-27: 160 ug via INTRAVENOUS
  Administered 2018-01-27 (×6): 80 ug via INTRAVENOUS

## 2018-01-27 MED ORDER — MIDAZOLAM HCL 2 MG/2ML IJ SOLN
INTRAMUSCULAR | Status: DC | PRN
  Administered 2018-01-27: 1 mg via INTRAVENOUS

## 2018-01-27 MED ORDER — MIDAZOLAM HCL 2 MG/2ML IJ SOLN
INTRAMUSCULAR | Status: AC
  Filled 2018-01-27: qty 2

## 2018-01-27 MED ORDER — ROCURONIUM BROMIDE 10 MG/ML (PF) SYRINGE
PREFILLED_SYRINGE | INTRAVENOUS | Status: DC | PRN
  Administered 2018-01-27: 40 mg via INTRAVENOUS

## 2018-01-27 MED ORDER — DEXAMETHASONE SODIUM PHOSPHATE 10 MG/ML IJ SOLN
INTRAMUSCULAR | Status: AC
  Filled 2018-01-27: qty 1

## 2018-01-27 MED ORDER — HYDROMORPHONE HCL 1 MG/ML IJ SOLN
0.5000 mg | INTRAMUSCULAR | Status: DC | PRN
  Administered 2018-01-27: 0.5 mg via INTRAVENOUS
  Filled 2018-01-27: qty 0.5

## 2018-01-27 MED ORDER — LIDOCAINE 2% (20 MG/ML) 5 ML SYRINGE
INTRAMUSCULAR | Status: AC
  Filled 2018-01-27: qty 5

## 2018-01-27 MED ORDER — ONDANSETRON HCL 4 MG/2ML IJ SOLN
INTRAMUSCULAR | Status: DC | PRN
  Administered 2018-01-27: 4 mg via INTRAVENOUS

## 2018-01-27 NOTE — Op Note (Signed)
[facility] Patient Name: Heather Hunt Procedure Date: 01/27/2018 MRN: [id number] Attending MD: Vida Rigger , MD Date of Birth: [birth year] CSN: [id number] Age: 37 Admit Type: Inpatient Procedure:                ERCP Indications:              Biliary dilation on Computed Tomogram Scan, Chronic                            pancreatitis, Biliary stent removal, Pancreatic                            stent removal Stents placed at [facility] 6 months ago Providers:                Vida Rigger, MD, Dwain Sarna, RN, Arlee Muslim                            Tech., Technician, Heron Nay, CRNA Referring MD:              Medicines:                General Anesthesia Complications:            No immediate complications. Estimated Blood Loss:     Estimated blood loss: none. Procedure:                Pre-Anesthesia Assessment:                           - Prior to the procedure, a History and Physical                            was performed, and patient medications and                            allergies were reviewed. The patient's tolerance of                            previous anesthesia was also reviewed. The risks                            and benefits of the procedure and the sedation                            options and risks were discussed with the patient.                            All questions were answered, and informed consent                            was obtained. Prior Anticoagulants: The patient has                            taken no previous anticoagulant or antiplatelet  agents. ASA Grade Assessment: II - A patient with                            mild systemic disease. After reviewing the risks                            and benefits, the patient was deemed in                            satisfactory condition to undergo the procedure.                           After obtaining informed consent, the scope was                             passed under direct vision. Throughout the                            procedure, the patient's blood pressure, pulse, and                            oxygen saturations were monitored continuously. The                            [id number]-[id number] ([id number]) Olympus ERCP was introduced                            through the mouth, and used to inject contrast into                            and used to inject contrast into the bile duct. The                            ERCP was accomplished without difficulty. The                            patient tolerated the procedure well. Scope In: Scope Out: Findings:      Two plastic stents originating in the biliary tree and the pancreatic       duct were emerging from the major papilla. The stents were visibly       occluded. The major papilla was edematous. We initially snared what we       thought was the CBD stent however it did not move under fluoroscopy so       we release that stent and re-snared the other and the stent was removed       but unfortunately it could not be pulled through the scope so the stent       in the scope were removed and one stent was removed from the biliary       tree using a snare. The stent was found to be occluded via the water       column test. We then readvanced the scope and easily cannulated deep       selectively the CBD on the initial attempt and she had an  obvious distal       stricture and some debris and sludge in the CBD and the the biliary tree       was swept with a 12 -15 mm adjustable balloon starting at the       bifurcation. Sludge was swept from the duct. We could only pull the 12       mm balloon through the distal duct with gentle pressure and we proceeded       with an occlusion cholangiogram after multiple sweeps and the stricture       was still present and there was no biliary drainage from the upper ducts       and we elected to place a biliary stent however we first tried to snare       the  pancreatic stent and leave the wire in place but the stent broke       halfway in the scope and we had to remove the scope to retrieve the       pancreatic stent and the wire was removed and one stent was removed from       the pancreatic duct using a snare. The scope was replaced and deep       selective cannulation was again easily obtained using the triple-lumen       sphincterotome loaded with the JAG Jagwire which is how we cannulated       the first time not mentioned above and we placed one 10 Fr by 6 cm       covered metal stent was placed 5.5 cm into the common bile duct. Bile       and sludge flowed through the stent. The stent was in good position.       There was excellent biliary drainage and throughout the procedure there       was no pancreatic duct injection or wire advancement and we elected not       to replace the pancreatic stent in an effort to see if that was still       needed Impression:               - Two visibly occluded stents from the biliary tree                            and the pancreatic duct were seen in the major                            papilla.                           - The major papilla appeared edematous.                           - One stent was removed from the biliary tree.                           - The biliary tree was swept and sludge was found.                           - One stent was removed from the pancreatic duct.                           -  One covered metal stent was placed into the                            common bile duct. Moderate Sedation:      N/A- Per Anesthesia Care Recommendation:           - Clear liquid diet for 6 hours. May slowly advance                            later today if doing well using low-fat diet                           - Continue present medications.                           - Return to GI clinic PRN. Might need follow-up at                            [facility] to reevaluate the pancreas and see if further                             pancreatic stenting or other pancreatic options are                            needed                           - Telephone GI clinic if symptomatic PRN.                           - Check liver enzymes (AST, ALT, alkaline                            phosphatase, bilirubin) and hemogram with white                            blood cell count and platelets tomorrow. Procedure Code(s):        --- Professional ---                           [phone], Endoscopic retrograde                            cholangiopancreatography (ERCP); with removal and                            exchange of stent(s), biliary or pancreatic duct,                            including pre- and post-dilation and guide wire                            passage, when performed, including sphincterotomy,  when performed, each stent exchanged                           [id number], Endoscopic retrograde                            cholangiopancreatography (ERCP); with removal of                            calculi/debris from biliary/pancreatic duct(s) Diagnosis Code(s):        --- Professional ---                           T85.590A, Other mechanical complication of bile                            duct prosthesis, initial encounter                           K83.8, Other specified diseases of biliary tract                           Z46.59, Encounter for fitting and adjustment of                            other gastrointestinal appliance and device                           K86.1, Other chronic pancreatitis CPT copyright 2017 [employer]. All rights reserved. The codes documented in this report are preliminary and upon coder review may  be revised to meet current compliance requirements. Vida Rigger, MD 01/27/2018 12:59:36 PM This report has been signed electronically. Number of Addenda: 0

## 2018-01-27 NOTE — Anesthesia Procedure Notes (Signed)
Date/Time: 01/27/2018 12:45 PM Performed by: Minerva Ends, CRNA Oxygen Delivery Method: Simple face mask Placement Confirmation: positive ETCO2 and breath sounds checked- equal and bilateral Dental Injury: Teeth and Oropharynx as per pre-operative assessment

## 2018-01-27 NOTE — Progress Notes (Signed)
Bronwen Betters 11:22 AM  Subjective: Patient seen and examined in hospital computer chart reviewed case discussed with my partner Dr. Kirtland Bouchard has no new complaints  Objective: Signs stable afebrile no acute distress exam please see previous assessment evaluation labs stable elevated liver tests CT and ultrasound reviewed  Assessment: CBD and pancreatic duct stent for chronic pancreatitis possibly needing change  Plan: We discussed the procedure and will proceed with anesthesia assistance with further work-up and plans pending those findings  [facility]  Pager [phone] After 5PM or if no answer call [phone]

## 2018-01-27 NOTE — Progress Notes (Signed)
Initial Nutrition Assessment  DOCUMENTATION CODES:   Not applicable  INTERVENTION:    Ensure Enlive po BID, each supplement provides 350 kcal and 20 grams of protein once diet is advacned  Recommend increasing creon to 36,000 units per meal  NUTRITION DIAGNOSIS:   Increased nutrient needs related to acute illness as evidenced by estimated needs.  GOAL:   Patient will meet greater than or equal to 90% of their needs  MONITOR:   PO intake, Supplement acceptance, Diet advancement, Labs, Weight trends, I & O's  REASON FOR ASSESSMENT:   Consult Assessment of nutrition requirement/status  ASSESSMENT:   Patient with PMH significant for pancreatitis, DM, and ETOH abuse. Presents this admission with severe epigastric pain. Admitted for acute on chronic pancreatitis.    Pt reluctant to provide nutrition information. States her intake differs from day to day and she is unable to provide dietary recall. She reports she can consistently eat fruit even when her abdominal pain worsens but not much else. She denies any recent drinking. She does not use supplementation. Pt is currently NPO for ERCP, but willing to try Ensure once diet is advanced.   Pt unable to provide UBW. She endorses that her clothes fit loser but says he weight fluctuate so often she is unsure how much she has lost. Records indicate pt weighed 137 lb on 08/03/17 at [facility] and 130 lb this admission (insignficiant wt loss of time frame). Nutrition-Focused physical exam completed.   Medications reviewed and include: fentanyl, Lantus, Creon 24,000 units TID Labs reviewed: AST 76 (H) ALT 105 (H)   NUTRITION - FOCUSED PHYSICAL EXAM:    Most Recent Value  Orbital Region  No depletion  Upper Arm Region  No depletion  Thoracic and Lumbar Region  No depletion  Buccal Region  No depletion  Temple Region  No depletion  Clavicle Bone Region  Mild depletion  Clavicle and Acromion Bone Region  Mild  depletion  Scapular Bone Region  No depletion  Dorsal Hand  No depletion  Patellar Region  Mild depletion  Anterior Thigh Region  Mild depletion  Posterior Calf Region  Mild depletion  Edema (RD Assessment)  None     Diet Order:   Diet Order            Diet NPO time specified  Diet effective midnight              EDUCATION NEEDS:   Education needs have been addressed  Skin:  Skin Assessment: Reviewed RN Assessment  Last BM:  01/26/18  Height:   Ht Readings from Last 1 Encounters:  01/25/18 5\' 6"  (1.676 m)    Weight:   Wt Readings from Last 1 Encounters:  01/25/18 59 kg    Ideal Body Weight:  59.1 kg  BMI:  Body mass index is 20.98 kg/m.  Estimated Nutritional Needs:   Kcal:  1500-1700 kcal   Protein:  75-90 grams  Fluid:  >/= 1.5 L/day  Vanessa Kick RD, LDN Clinical Nutrition Pager # - [phone]

## 2018-01-27 NOTE — Anesthesia Preprocedure Evaluation (Addendum)
Anesthesia Evaluation  Patient identified by MRN, date of birth, ID band Patient awake    Reviewed: Allergy & Precautions, NPO status , Patient's Chart, lab work & pertinent test results  Airway Mallampati: II  TM Distance: >3 FB Neck ROM: Full    Dental  (+) Dental Advisory Given, Poor Dentition, Chipped,    Pulmonary Current Smoker,    Pulmonary exam normal breath sounds clear to auscultation       Cardiovascular Normal cardiovascular exam Rhythm:Regular Rate:Normal     Neuro/Psych negative neurological ROS     GI/Hepatic Neg liver ROS, GERD  Medicated,Abnormal LFTs, CBD stent in place, chronic pancreatitis, history of CBD stricture   Endo/Other  diabetes, Type 2, Insulin Dependent  Renal/GU negative Renal ROS     Musculoskeletal negative musculoskeletal ROS (+)   Abdominal   Peds  Hematology  (+) Blood dyscrasia (Thrombocytopenia), anemia ,   Anesthesia Other Findings Day of surgery medications reviewed with the patient.  Reproductive/Obstetrics                          Anesthesia Physical Anesthesia Plan  ASA: III  Anesthesia Plan: General   Post-op Pain Management:    Induction: Intravenous  PONV Risk Score and Plan: 3 and Midazolam, Ondansetron and Diphenhydramine  Airway Management Planned: Oral ETT  Additional Equipment:   Intra-op Plan:   Post-operative Plan: Extubation in OR  Informed Consent: I have reviewed the patients History and Physical, chart, labs and discussed the procedure including the risks, benefits and alternatives for the proposed anesthesia with the patient or authorized representative who has indicated his/her understanding and acceptance.   Dental advisory given  Plan Discussed with: CRNA  Anesthesia Plan Comments:         Anesthesia Quick Evaluation

## 2018-01-27 NOTE — Transfer of Care (Signed)
Immediate Anesthesia Transfer of Care Note  Patient: Heather Hunt  Procedure(s) Performed: ENDOSCOPIC RETROGRADE CHOLANGIOPANCREATOGRAPHY (ERCP) (N/A )  Patient Location: PACU and Endoscopy Unit  Anesthesia Type:General  Level of Consciousness: sedated  Airway & Oxygen Therapy: Patient Spontanous Breathing and Patient connected to face mask oxygen  Post-op Assessment: Report given to RN and Post -op Vital signs reviewed and stable  Post vital signs: Reviewed and stable  Last Vitals:  Vitals Value Taken Time  BP    Temp    Pulse 66 01/27/2018 12:56 PM  Resp 15 01/27/2018 12:56 PM  SpO2 100 % 01/27/2018 12:56 PM  Vitals shown include unvalidated device data.  Last Pain:  Vitals:   01/27/18 1052  TempSrc: Oral  PainSc: 7       Patients Stated Pain Goal: 3 (01/27/18 4098)  Complications: No apparent anesthesia complications

## 2018-01-27 NOTE — Anesthesia Procedure Notes (Signed)
Procedure Name: Intubation Date/Time: 01/27/2018 11:41 AM Performed by: Minerva Ends, CRNA Pre-anesthesia Checklist: Patient identified, Emergency Drugs available, Suction available and Patient being monitored Patient Re-evaluated:Patient Re-evaluated prior to induction Oxygen Delivery Method: Circle System Utilized Preoxygenation: Pre-oxygenation with 100% oxygen Induction Type: IV induction Ventilation: Mask ventilation without difficulty Laryngoscope Size: Miller and 2 Grade View: Grade I Tube type: Oral Tube size: 7.0 mm Number of attempts: 1 Airway Equipment and Method: Stylet Placement Confirmation: ETT inserted through vocal cords under direct vision,  positive ETCO2 and breath sounds checked- equal and bilateral Secured at: 21 cm Tube secured with: Tape Dental Injury: Teeth and Oropharynx as per pre-operative assessment  Comments: Smooth IV induction Turk-- intubation AM CRNA -- atraumatic - teeth and mouth as preop -- left front with chip-- right front with irregular surface-- both discolored-- unchanged after laryngoscopy-- bilat BS Desmond Lope

## 2018-01-27 NOTE — Progress Notes (Signed)
Patient is refusing to drink anything for dinner. Explained to her that she had some insulin earlier and to avoid another hypoglycemic event, she needs to have some PO intake. She became agitated and replied "what.Marland KitchenMarland KitchenMarland KitchenI understand".  Sprite drink taken to her but she is refusing to drink it. Dr Caleb Popp paged.  Lina Sar, RN

## 2018-01-27 NOTE — Progress Notes (Signed)
PROGRESS NOTE    Heather Hunt  NFA:[id number] DOB: 1980-07-01 DOA: 01/25/2018 PCP: Patient, No Pcp Per   Brief Narrative: Heather Hunt is a 37 y.o. female with medical history significant of pancreatitis, DM2 on insulin, ETOH abuse. Patient presented with abdominal pain and found to have acute on chronic pancreatitis.   Assessment & Plan:   Active Problems:   Transaminitis   Pancreatitis   Dehydration   Hyperglycemia   Acute on chronic pancreatitis In setting of obstructed pancreatic and biliary tree ducts. Replaced pancreatic duct stent and swept biliary tree duct by GI on 10/3. -Clear liquid diet per GI recommendations. Advance as tolerated -Continue home fentanyl patch and Norco -Continue Creon -Continue IV fluids  Elevated AST/ALT/Alkaline phosphatase Secondary to stent malfunction. Patient is s/p ERCP with results mentioned above. -AM CMP and CBC  Diabetes mellitus, insulin dependent Patient did not get Lantus 26 units this morning -Lantus 10 units once today -Resume Lantus 26 units daily tomorrow and continue SSI with meals  Tobacco abuse Cessation discussed.   DVT prophylaxis: SCDs Code Status:   Code Status: Full Code Family Communication: None Disposition Plan: Discharge pending GI recommendations   Consultants:   Gastroenterology  Procedures:    10/3: ERCP Impression:               - Two visibly occluded stents from the biliary tree                            and the pancreatic duct were seen in the major                            papilla.                           - The major papilla appeared edematous.                           - One stent was removed from the biliary tree.                           - The biliary tree was swept and sludge was found.                           - One stent was removed from the pancreatic duct.                           - One covered metal stent was placed into the                            common bile  duct.  Recommendation:           - Clear liquid diet for 6 hours. May slowly advance                            later today if doing well using low-fat diet                           - Continue present medications.                           -  Return to GI clinic PRN. Might need follow-up at                            [facility] to reevaluate the pancreas and see if further                            pancreatic stenting or other pancreatic options are                            needed                           - Telephone GI clinic if symptomatic PRN.                           - Check liver enzymes (AST, ALT, alkaline                            phosphatase, bilirubin) and hemogram with white                            blood cell count and platelets tomorrow.  Antimicrobials:  None    Subjective: Pain this morning.  Objective: Vitals:   01/27/18 1300 01/27/18 1310 01/27/18 1320 01/27/18 1400  BP: 125/80 116/87 108/79 104/71  Pulse: 66 69 63 62  Resp: 16 12 13 16   Temp:  97.7 F (36.5 C)  98.8 F (37.1 C)  TempSrc:  Oral  Oral  SpO2: 98% 97% 95% 100%  Weight:      Height:        Intake/Output Summary (Last 24 hours) at 01/27/2018 1445 Last data filed at 01/27/2018 1246 Gross per 24 hour  Intake 2676.29 ml  Output 0 ml  Net 2676.29 ml   Filed Weights   01/25/18 1601  Weight: 59 kg    Examination:  General exam: Appears calm and comfortable   Data Reviewed: I have personally reviewed following labs and imaging studies  CBC: Recent Labs  Lab 01/25/18 1620 01/26/18 0409 01/27/18 0803  WBC 9.2 5.0 5.0  NEUTROABS 7.4  --   --   HGB 12.9 11.7* 11.8*  HCT 38.5 35.6* 36.9  MCV 90.0 90.8 91.3  PLT 163 145* 139*   Basic Metabolic Panel: Recent Labs  Lab 01/25/18 1620 01/25/18 2103 01/26/18 0409 01/27/18 0803  NA 133*  --  144 142  K 3.7  --  3.1* 3.5  CL 97*  --  110 113*  CO2 25  --  29 24  GLUCOSE 373*  --  118* 67*  BUN 8  --  7 <5*  CREATININE 0.52  --   0.35* 0.32*  CALCIUM 8.9  --  8.4* 8.2*  MG  --  1.7 1.9  --   PHOS  --  2.7 2.7  --    GFR: Estimated Creatinine Clearance: 91.4 mL/min (A) (by C-G formula based on SCr of 0.32 mg/dL (L)). Liver Function Tests: Recent Labs  Lab 01/25/18 1620 01/26/18 0409 01/27/18 0803  AST 253* 178* 76*  ALT 197* 154* 105*  ALKPHOS 825* 764* 650*  BILITOT 1.3* 0.9 0.8  PROT 7.1 5.8* 5.3*  ALBUMIN 3.5 2.9* 2.5*   Recent Labs  Lab 01/25/18 1620  LIPASE 109*   No results for input(s): AMMONIA in the last 168 hours. Coagulation Profile: No results for input(s): INR, PROTIME in the last 168 hours. Cardiac Enzymes: Recent Labs  Lab 01/25/18 1620  CKTOTAL 50   BNP (last 3 results) No results for input(s): PROBNP in the last 8760 hours. HbA1C: Recent Labs    01/26/18 0450  HGBA1C 13.4*   CBG: Recent Labs  Lab 01/27/18 0007 01/27/18 0455 01/27/18 0802 01/27/18 0840 01/27/18 1330  GLUCAP 125* 152* 65* 89 89   Lipid Profile: No results for input(s): CHOL, HDL, LDLCALC, TRIG, CHOLHDL, LDLDIRECT in the last 72 hours. Thyroid Function Tests: Recent Labs    01/26/18 0450  TSH 2.150   Anemia Panel: No results for input(s): VITAMINB12, FOLATE, FERRITIN, TIBC, IRON, RETICCTPCT in the last 72 hours. Sepsis Labs: Recent Labs  Lab 01/25/18 1814  LATICACIDVEN 1.10    No results found for this or any previous visit (from the past 240 hour(s)).       Radiology Studies: Ct Abdomen Pelvis W Contrast  Result Date: 01/25/2018 CLINICAL DATA:  Abdominal pain, history of recurrent pancreatitis EXAM: CT ABDOMEN AND PELVIS WITH CONTRAST TECHNIQUE: Multidetector CT imaging of the abdomen and pelvis was performed using the standard protocol following bolus administration of intravenous contrast. CONTRAST:  ISOVUE-300 IOPAMIDOL (ISOVUE-300) INJECTION 61% COMPARISON:  01/13/2018 FINDINGS: Lower chest: Minor dependent atelectasis/hyperventilation. No lower lobe pneumonia. Normal heart  size. No pericardial pleural effusion. Hepatobiliary: Stable diffuse biliary dilatation. Remote cholecystectomy noted. Endoscopic pancreatic and common bile duct stents are stable in position. No new focal hepatic abnormality. Pancreas: Similar appearance of diffuse pancreatic edema with decreased parenchymal enhancement. Diffuse coarse pancreatic calcifications. Stable pancreatic ductal dilatation with a proximal pancreatic stent extending into the duodenum. Similar peripancreatic mild strandy edema/fluid. Multifocal pancreatic loculated fluid collections as before, largest in the pancreatic head region is 2.0 cm, previously 2.4 cm. No new fluid collections. Overall stable appearance of acute on chronic pancreatitis. No significant interval change or new finding. Spleen: Normal in size without focal abnormality. Adrenals/Urinary Tract: Normal adrenal glands. No acute renal obstruction, hydronephrosis, hydroureter, or obstructing ureteral calculus. Urinary bladder unremarkable. 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 Stomach/Bowel: Negative for bowel obstruction, significant dilatation, ileus, or free air. Normal appendix demonstrated. Moderate colonic stool burden. No fluid collection or abscess. Overall stable appearance. Vascular/Lymphatic: Aortic atherosclerosis noted without occlusive process or aneurysm. No retroperitoneal hemorrhage. Mesenteric and renal vasculature remain patent. No adenopathy. Stable enlargement of the portal vein and mesenteric veins without portal vein occlusion or thrombus. 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 Reproductive: Uterus normal in size. Right ovary and adnexa unremarkable. Left ovary contains a hypodense cyst measuring 5.1 cm.  Other: No abdominal wall hernia or abnormality. No abdominopelvic ascites. 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 Stable pancreatic duct dilatation and diffuse biliary dilatation with unchanged endoscopic common bile duct and pancreatic stents. No new fluid collection or abscess. 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 Portal vein and mesenteric veins remain patent. 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  Shick M.D.  On: 01/25/2018 19:27   Dg Ercp  Result Date: 01/27/2018 CLINICAL DATA:  37 year old female with a history of pancreatitis, pancreatic stents. EXAM: ERCP TECHNIQUE: Multiple spot images obtained with the fluoroscopic device and submitted for interpretation post-procedure. FLUOROSCOPY TIME:  Fluoroscopy Time:  3 minutes 35 seconds COMPARISON:  CT 01/25/2018, 01/13/2018 FINDINGS: Limited images during ERCP. Initial image demonstrates endoscope projecting over the upper abdomen. Plastic biliary stent in position with surgical changes of cholecystectomy. There is partial opacification of the extrahepatic biliary ducts, with placement of metallic biliary stent and evacuation of the contrast. IMPRESSION: Limited images during ERCP demonstrates partial opacification of the extrahepatic biliary ducts, removal of the plastic biliary stent, and placement of a metal biliary stent. Please refer to the dictated operative report for full details of intraoperative findings and procedure. Electronically Signed   By: Gilmer Mor D.O.   On: 01/27/2018 13:04        Scheduled Meds: . escitalopram  10 mg Oral Daily  . feeding supplement (ENSURE ENLIVE)  237 mL Oral BID BM  . fentaNYL  25 mcg Transdermal Q72H   . insulin aspart  0-9 Units Subcutaneous Q4H  . insulin glargine  26 Units Subcutaneous Daily  . lipase/protease/amylase  24,000 Units Oral TID  . nicotine  14 mg Transdermal Daily  . pantoprazole  40 mg Oral Daily  . pregabalin  50 mg Oral TID   Continuous Infusions:    LOS: 2 days     Jacquelin Hawking, MD [facility] 01/27/2018, 2:45 PM Pager: [phone]  If 7PM-7AM, please contact night-coverage [email or web address] 01/27/2018, 2:45 PM

## 2018-01-28 ENCOUNTER — Encounter (HOSPITAL_COMMUNITY): Payer: Self-pay | Admitting: Gastroenterology

## 2018-01-28 DIAGNOSIS — D696 Thrombocytopenia, unspecified: Secondary | ICD-10-CM

## 2018-01-28 LAB — CBC WITH DIFFERENTIAL/PLATELET
BASOS PCT: 0 %
Basophils Absolute: 0 10*3/uL (ref 0.0–0.1)
EOS ABS: 0.1 10*3/uL (ref 0.0–0.7)
EOS PCT: 2 %
HCT: 35.7 % — ABNORMAL LOW (ref 36.0–46.0)
Hemoglobin: 11.7 g/dL — ABNORMAL LOW (ref 12.0–15.0)
Lymphocytes Relative: 29 %
Lymphs Abs: 2 10*3/uL (ref 0.7–4.0)
MCH: 30 pg (ref 26.0–34.0)
MCHC: 32.8 g/dL (ref 30.0–36.0)
MCV: 91.5 fL (ref 78.0–100.0)
MONO ABS: 0.3 10*3/uL (ref 0.1–1.0)
MONOS PCT: 5 %
NEUTROS PCT: 64 %
Neutro Abs: 4.4 10*3/uL (ref 1.7–7.7)
PLATELETS: 131 10*3/uL — AB (ref 150–400)
RBC: 3.9 MIL/uL (ref 3.87–5.11)
RDW: 17 % — AB (ref 11.5–15.5)
WBC: 6.9 10*3/uL (ref 4.0–10.5)

## 2018-01-28 LAB — COMPREHENSIVE METABOLIC PANEL
ALBUMIN: 2.6 g/dL — AB (ref 3.5–5.0)
ALT: 82 U/L — AB (ref 0–44)
AST: 40 U/L (ref 15–41)
Alkaline Phosphatase: 642 U/L — ABNORMAL HIGH (ref 38–126)
Anion gap: 8 (ref 5–15)
BUN: 6 mg/dL (ref 6–20)
CHLORIDE: 110 mmol/L (ref 98–111)
CO2: 26 mmol/L (ref 22–32)
CREATININE: 0.35 mg/dL — AB (ref 0.44–1.00)
Calcium: 8.6 mg/dL — ABNORMAL LOW (ref 8.9–10.3)
GFR calc non Af Amer: >60 mL/min (ref >60)
Glucose, Bld: 83 mg/dL (ref 70–99)
Potassium: 3.4 mmol/L — ABNORMAL LOW (ref 3.5–5.1)
Sodium: 144 mmol/L (ref 135–145)
Total Bilirubin: 0.7 mg/dL (ref 0.3–1.2)
Total Protein: 5.7 g/dL — ABNORMAL LOW (ref 6.5–8.1)

## 2018-01-28 LAB — GLUCOSE, CAPILLARY
GLUCOSE-CAPILLARY: 281 mg/dL — AB (ref 70–99)
GLUCOSE-CAPILLARY: 77 mg/dL (ref 70–99)
GLUCOSE-CAPILLARY: 167 mg/dL — AB (ref 70–99)
Glucose-Capillary: 208 mg/dL — ABNORMAL HIGH (ref 70–99)
Glucose-Capillary: 224 mg/dL — ABNORMAL HIGH (ref 70–99)
Glucose-Capillary: 408 mg/dL — ABNORMAL HIGH (ref 70–99)
Glucose-Capillary: 45 mg/dL — ABNORMAL LOW (ref 70–99)
Glucose-Capillary: 65 mg/dL — ABNORMAL LOW (ref 70–99)

## 2018-01-28 MED ORDER — INSULIN ASPART 100 UNIT/ML ~~LOC~~ SOLN
10.0000 [IU] | Freq: Once | SUBCUTANEOUS | Status: AC
  Administered 2018-01-28: 10 [IU] via SUBCUTANEOUS

## 2018-01-28 MED ORDER — KETOROLAC TROMETHAMINE 15 MG/ML IJ SOLN
15.0000 mg | Freq: Four times a day (QID) | INTRAMUSCULAR | Status: DC | PRN
  Administered 2018-01-28 – 2018-01-29 (×2): 15 mg via INTRAVENOUS
  Filled 2018-01-28 (×2): qty 1

## 2018-01-28 MED ORDER — OXYCODONE HCL 5 MG PO TABS
5.0000 mg | ORAL_TABLET | Freq: Once | ORAL | Status: AC
  Administered 2018-01-28: 5 mg via ORAL
  Filled 2018-01-28: qty 1

## 2018-01-28 MED ORDER — INSULIN GLARGINE 100 UNIT/ML ~~LOC~~ SOLN
20.0000 [IU] | Freq: Every day | SUBCUTANEOUS | Status: DC
  Administered 2018-01-28: 20 [IU] via SUBCUTANEOUS
  Filled 2018-01-28 (×2): qty 0.2

## 2018-01-28 NOTE — Progress Notes (Addendum)
Hypoglycemic Event  CBG: 45  Treatment: Sprite  Symptoms: none  Follow-up CBG: Time: 0832 CBG Result: 77 Possible Reasons for Event:   Comments/MD notified: Dr Caleb Popp paged    [name], [name]

## 2018-01-28 NOTE — Progress Notes (Signed)
[facility] PROGRESS NOTE Subjective Patient had ERCP and stent exchange.  The pancreatic and biliary stent placed at [facility] were occluded and removed.  One covered metal stent was placed in the CBD.  The pancreatic stent was removed.  The patient notes that she does have some discomfort with eating but no different than normal.  Her blood sugar got low when she is eager to advance her diet.  Objective: Vital signs in last 24 hours: Temp:  [97.3 F (36.3 C)-98.8 F (37.1 C)] 97.9 F (36.6 C) (10/04 0506) Pulse Rate:  [56-69] 63 (10/04 0506) Resp:  [10-18] 15 (10/04 0506) BP: (93-125)/(63-87) 100/66 (10/04 0506) SpO2:  [95 %-100 %] 98 % (10/04 0506) Last BM Date: 01/26/18  Intake/Output from previous day: 10/03 0701 - 10/04 0700 In: 1960 [P.O.:960; I.V.:1000] Out: 0  Intake/Output this shift: No intake/output data recorded.  PE: General--no acute distress  Abdomen--nondistended bowel sounds present minimal tenderness  Lab Results: Recent Labs    01/25/18 1620 01/26/18 0409 01/27/18 0803 01/28/18 0436  WBC 9.2 5.0 5.0 6.9  HGB 12.9 11.7* 11.8* 11.7*  HCT 38.5 35.6* 36.9 35.7*  PLT 163 145* 139* 131*   BMET Recent Labs    01/25/18 1620 01/26/18 0409 01/27/18 0803 01/28/18 0436  NA 133* 144 142 144  K 3.7 3.1* 3.5 3.4*  CL 97* 110 113* 110  CO2 25 29 24 26   CREATININE 0.52 0.35* 0.32* 0.35*   LFT Recent Labs    01/26/18 0409 01/27/18 0803 01/28/18 0436  PROT 5.8* 5.3* 5.7*  AST 178* 76* 40  ALT 154* 105* 82*  ALKPHOS 764* 650* 642*  BILITOT 0.9 0.8 0.7   PT/INR No results for input(s): LABPROT, INR in the last 72 hours. PANCREAS Recent Labs    01/25/18 1620  LIPASE 109*         Studies/Results: Dg Ercp  Result Date: 01/27/2018 CLINICAL DATA:  37 year old female with a history of pancreatitis, pancreatic stents. EXAM: ERCP TECHNIQUE: Multiple spot images obtained with the fluoroscopic device and submitted for interpretation  post-procedure. FLUOROSCOPY TIME:  Fluoroscopy Time:  3 minutes 35 seconds COMPARISON:  CT 01/25/2018, 01/13/2018 FINDINGS: Limited images during ERCP. Initial image demonstrates endoscope projecting over the upper abdomen. Plastic biliary stent in position with surgical changes of cholecystectomy. There is partial opacification of the extrahepatic biliary ducts, with placement of metallic biliary stent and evacuation of the contrast. IMPRESSION: Limited images during ERCP demonstrates partial opacification of the extrahepatic biliary ducts, removal of the plastic biliary stent, and placement of a metal biliary stent. Please refer to the dictated operative report for full details of intraoperative findings and procedure. Electronically Signed   By: Heather Hunt D.O.   On: 01/27/2018 13:04    Medications: I have reviewed the patient's current medications.  Assessment:   1.  Occluded biliary and pancreatic stent.  Biliary stent replaced with covered metal stent and pancreatic stent removed. 2.  Chronic pancreatitis.  Due to ethanol but has stopped drinking. 3.  Diabetes.  Patient on insulin.   Plan: 1.  Patient has now moved to [facility] and would like to follow-up with [facility].  When she is discharged  appointment can be made with Heather Hunt or Heather Hunt. 2.  We will advance to carb modified diet.   Heather Hunt 01/28/2018, 8:57 AM  This note was created using voice recognition software. Minor errors may Have occurred unintentionally.  Pager: [phone] If no answer or after hours call [phone]

## 2018-01-28 NOTE — Progress Notes (Signed)
PROGRESS NOTE    Heather Hunt  YNW:[id number] DOB: October 23, 1980 DOA: 01/25/2018 PCP: Patient, No Pcp Per   Brief Narrative: Heather Hunt is a 37 y.o. female with medical history significant of pancreatitis, DM2 on insulin, ETOH abuse. Patient presented with abdominal pain and found to have acute on chronic pancreatitis.   Assessment & Plan:   Active Problems:   Transaminitis   Pancreatitis   Dehydration   Hyperglycemia   Acute on chronic pancreatitis In setting of obstructed pancreatic and biliary tree ducts. Replaced pancreatic duct stent and swept biliary tree duct by GI on 10/3. -Clear liquid diet per GI recommendations. Advance as tolerated -Continue home fentanyl patch and Norco -Continue Creon -Continue IV fluids  Elevated AST/ALT/Alkaline phosphatase Secondary to stent malfunction. Patient is s/p ERCP with results mentioned above. Alkaline phosphatase, AST, ALT improving. -GI recomendations  Diabetes mellitus, insulin dependent Patient hyperglycemic overnight, received 10 units of aspart and is hypoglycemic this morning. -Decrease to Lantus 20 units daily and continue SSI with meals  Tobacco abuse Cessation discussed.  Thrombocytopenia Acutely down. Likely reactive. Mild.   DVT prophylaxis: SCDs Code Status:   Code Status: Full Code Family Communication: None Disposition Plan: Discharge pending GI recommendations and better control of blood sugar   Consultants:   Gastroenterology  Procedures:    10/3: ERCP Impression:               - Two visibly occluded stents from the biliary tree                            and the pancreatic duct were seen in the major                            papilla.                           - The major papilla appeared edematous.                           - One stent was removed from the biliary tree.                           - The biliary tree was swept and sludge was found.                           - One stent was  removed from the pancreatic duct.                           - One covered metal stent was placed into the                            common bile duct.  Recommendation:           - Clear liquid diet for 6 hours. May slowly advance                            later today if doing well using low-fat diet                           -  Continue present medications.                           - Return to GI clinic PRN. Might need follow-up at                            [facility] to reevaluate the pancreas and see if further                            pancreatic stenting or other pancreatic options are                            needed                           - Telephone GI clinic if symptomatic PRN.                           - Check liver enzymes (AST, ALT, alkaline                            phosphatase, bilirubin) and hemogram with white                            blood cell count and platelets tomorrow.  Antimicrobials:  None    Subjective: Some pain today. States it is better than admission. No nausea/vomiting.  Objective: Vitals:   01/27/18 1320 01/27/18 1400 01/27/18 2031 01/28/18 0506  BP: 108/79 104/71 109/77 100/66  Pulse: 63 62 66 63  Resp: 13 16 18 15   Temp:  98.8 F (37.1 C) (!) 97.3 F (36.3 C) 97.9 F (36.6 C)  TempSrc:  Oral Oral Oral  SpO2: 95% 100% 96% 98%  Weight:      Height:        Intake/Output Summary (Last 24 hours) at 01/28/2018 0852 Last data filed at 01/28/2018 0509 Gross per 24 hour  Intake 1960 ml  Output 0 ml  Net 1960 ml   Filed Weights   01/25/18 1601  Weight: 59 kg    Examination:  General exam: Appears calm and comfortable Respiratory system: Clear to auscultation. Respiratory effort normal. Cardiovascular system: S1 & S2 heard, RRR. No murmurs, rubs, gallops or clicks. Gastrointestinal system: Abdomen is nondistended, soft and tender in epigastric area. Normal bowel sounds heard. Central nervous system: Alert and oriented. No focal  neurological deficits. Extremities: No edema. No calf tenderness Skin: No cyanosis. No rashes Psychiatry: Judgement and insight appear normal. Flat affect.   Data Reviewed: I have personally reviewed following labs and imaging studies  CBC: Recent Labs  Lab 01/25/18 1620 01/26/18 0409 01/27/18 0803 01/28/18 0436  WBC 9.2 5.0 5.0 6.9  NEUTROABS 7.4  --   --  4.4  HGB 12.9 11.7* 11.8* 11.7*  HCT 38.5 35.6* 36.9 35.7*  MCV 90.0 90.8 91.3 91.5  PLT 163 145* 139* 131*   Basic Metabolic Panel: Recent Labs  Lab 01/25/18 1620 01/25/18 2103 01/26/18 0409 01/27/18 0803 01/28/18 0436  NA 133*  --  144 142 144  K 3.7  --  3.1* 3.5 3.4*  CL 97*  --  110 113* 110  CO2 25  --  29 24 26   GLUCOSE 373*  --  118* 67* 83  BUN 8  --  7 <5* 6  CREATININE 0.52  --  0.35* 0.32* 0.35*  CALCIUM 8.9  --  8.4* 8.2* 8.6*  MG  --  1.7 1.9  --   --   PHOS  --  2.7 2.7  --   --    GFR: Estimated Creatinine Clearance: 91.4 mL/min (A) (by C-G formula based on SCr of 0.35 mg/dL (L)). Liver Function Tests: Recent Labs  Lab 01/25/18 1620 01/26/18 0409 01/27/18 0803 01/28/18 0436  AST 253* 178* 76* 40  ALT 197* 154* 105* 82*  ALKPHOS 825* 764* 650* 642*  BILITOT 1.3* 0.9 0.8 0.7  PROT 7.1 5.8* 5.3* 5.7*  ALBUMIN 3.5 2.9* 2.5* 2.6*   Recent Labs  Lab 01/25/18 1620  LIPASE 109*   No results for input(s): AMMONIA in the last 168 hours. Coagulation Profile: No results for input(s): INR, PROTIME in the last 168 hours. Cardiac Enzymes: Recent Labs  Lab 01/25/18 1620  CKTOTAL 50   BNP (last 3 results) No results for input(s): PROBNP in the last 8760 hours. HbA1C: Recent Labs    01/26/18 0450  HGBA1C 13.4*   CBG: Recent Labs  Lab 01/28/18 0001 01/28/18 0137 01/28/18 0500 01/28/18 0810 01/28/18 0832  GLUCAP 408* 281* 65* 45* 77   Lipid Profile: No results for input(s): CHOL, HDL, LDLCALC, TRIG, CHOLHDL, LDLDIRECT in the last 72 hours. Thyroid Function Tests: Recent Labs      01/26/18 0450  TSH 2.150   Anemia Panel: No results for input(s): VITAMINB12, FOLATE, FERRITIN, TIBC, IRON, RETICCTPCT in the last 72 hours. Sepsis Labs: Recent Labs  Lab 01/25/18 1814  LATICACIDVEN 1.10    No results found for this or any previous visit (from the past 240 hour(s)).       Radiology Studies: Dg Ercp  Result Date: 01/27/2018 CLINICAL DATA:  37 year old female with a history of pancreatitis, pancreatic stents. EXAM: ERCP TECHNIQUE: Multiple spot images obtained with the fluoroscopic device and submitted for interpretation post-procedure. FLUOROSCOPY TIME:  Fluoroscopy Time:  3 minutes 35 seconds COMPARISON:  CT 01/25/2018, 01/13/2018 FINDINGS: Limited images during ERCP. Initial image demonstrates endoscope projecting over the upper abdomen. Plastic biliary stent in position with surgical changes of cholecystectomy. There is partial opacification of the extrahepatic biliary ducts, with placement of metallic biliary stent and evacuation of the contrast. IMPRESSION: Limited images during ERCP demonstrates partial opacification of the extrahepatic biliary ducts, removal of the plastic biliary stent, and placement of a metal biliary stent. Please refer to the dictated operative report for full details of intraoperative findings and procedure. Electronically Signed   By: Gilmer Mor D.O.   On: 01/27/2018 13:04        Scheduled Meds: . escitalopram  10 mg Oral Daily  . feeding supplement (ENSURE ENLIVE)  237 mL Oral BID BM  . fentaNYL  25 mcg Transdermal Q72H  . insulin aspart  0-9 Units Subcutaneous TID WC  . insulin glargine  20 Units Subcutaneous Daily  . lipase/protease/amylase  24,000 Units Oral TID  . nicotine  14 mg Transdermal Daily  . pantoprazole  40 mg Oral Daily  . pregabalin  50 mg Oral TID   Continuous Infusions:    LOS: 3 days     Jacquelin Hawking, MD [facility] 01/28/2018, 8:52 AM Pager: [phone]  If 7PM-7AM, please contact  night-coverage [email or web address] 01/28/2018, 8:52 AM

## 2018-01-28 NOTE — Anesthesia Postprocedure Evaluation (Signed)
Anesthesia Post Note  Patient: Lonita Debes  Procedure(s) Performed: ENDOSCOPIC RETROGRADE CHOLANGIOPANCREATOGRAPHY (ERCP) (N/A ) STENT REMOVAL BILIARY STENT PLACEMENT (N/A )     Patient location during evaluation: PACU Anesthesia Type: General Level of consciousness: awake and alert, awake and oriented Pain management: pain level controlled Vital Signs Assessment: post-procedure vital signs reviewed and stable Respiratory status: spontaneous breathing, nonlabored ventilation and respiratory function stable Cardiovascular status: blood pressure returned to baseline and stable Postop Assessment: no apparent nausea or vomiting Anesthetic complications: no    Last Vitals:  Vitals:   01/27/18 2031 01/28/18 0506  BP: 109/77 100/66  Pulse: 66 63  Resp: 18 15  Temp: (!) 36.3 C 36.6 C  SpO2: 96% 98%    Last Pain:  Vitals:   01/28/18 0506  TempSrc: Oral  PainSc:                  Cecile Hearing

## 2018-01-28 NOTE — Progress Notes (Signed)
Inpatient Diabetes Program Recommendations  AACE/ADA: New Consensus Statement on Inpatient Glycemic Control (2015)  Target Ranges:  Prepandial:   less than 140 mg/dL      Peak postprandial:   less than 180 mg/dL (1-2 hours)      Critically ill patients:  140 - 180 mg/dL   Lab Results  Component Value Date   GLUCAP 167 (H) 01/28/2018   HGBA1C 13.4 (H) 01/26/2018    Review of Glycemic Control  HgbA1C - 13.4%. Blood sugars very labile. Chronic pancreatitis d/t ethanol but has stopped drinking.  Briefly discussed HgbA1C of 13.4%. Discussed goals and importance of controlling blood sugars to prevent long-term complications. Encouraged to check blood sugars more frequently and f/u with PCP who manages her DM.   Agree with insulin orders.  Will follow.   Thank you. Ailene Ards, RD, LDN, CDE Inpatient Diabetes Coordinator [phone]

## 2018-01-28 NOTE — Progress Notes (Signed)
Notified MD concerning pts BG.

## 2018-01-29 LAB — GLUCOSE, CAPILLARY: Glucose-Capillary: 256 mg/dL — ABNORMAL HIGH (ref 70–99)

## 2018-01-29 MED ORDER — ENSURE ENLIVE PO LIQD: 237.0000 mL | Freq: Two times a day (BID) | ORAL | 0 refills | Status: AC

## 2018-01-29 MED ORDER — INSULIN GLARGINE 100 UNIT/ML ~~LOC~~ SOLN
26.0000 [IU] | Freq: Every day | SUBCUTANEOUS | Status: DC
  Administered 2018-01-29: 26 [IU] via SUBCUTANEOUS
  Filled 2018-01-29: qty 0.26

## 2018-01-29 NOTE — Discharge Summary (Signed)
Physician Discharge Summary  Achol Azpeitia TWS:[id number] DOB: 04-01-1981 DOA: 01/25/2018  PCP: Patient, No Pcp Per  Admit date: 01/25/2018 Discharge date: 01/29/2018  Admitted From: Home Disposition: Home  Recommendations for Outpatient Follow-up:  1. Follow up with PCP in 1 week 2. Follow up with [facility] in 2 weeks 3. Please obtain CMP/CBC in one week 4. Please follow up on the following pending results: None  Home Health: None Equipment/Devices: None  Discharge Condition: Stabel CODE STATUS: Full code Diet recommendation: Carb modified   Brief/Interim Summary:  Admission HPI written by Toy Baker, MD   HPI: Heather Hunt is a 37 y.o. female with medical history significant of pancreatitis, DM2 on insulin, ETOH abuse now in remmision    Presented with severe epigasrtric/RUQ  Pain Last admission was in September at   that time General surgery has been consulted patient has stent in place No indication for surgical intervention recommended that patient   will need to have follow-up with GI at [facility] note today her BG 270 this AM used insulin Past patient required PEG tube in 2018 secondary to poor nutrition.  Patient has history of moderate protein calorie malnutrition during prior admission she had transient elevation of LFTs which improved   Hospital course:  Acute on chronic pancreatitis In setting of obstructed pancreatic and biliary tree ducts. Replaced pancreatic duct stent and swept biliary tree duct by GI on 10/3. Diet advanced well. Outpatient follow-up with [facility].  Elevated AST/ALT/Alkaline phosphatase Secondary to stent malfunction. Patient is s/p ERCP with results mentioned above. Alkaline phosphatase, AST, ALT improving. Recommend repeat comprehensive metabolic panel.  Diabetes mellitus, insulin dependent Managed with Lantus and sliding scale insulin. Resume Tyler Aas.  Tobacco abuse Cessation  discussed.  Thrombocytopenia Acutely down. Likely reactive. Mild.  Discharge Diagnoses:  Active Problems:   Transaminitis   Pancreatitis   Dehydration   Hyperglycemia    Discharge Instructions  Discharge Instructions    Call MD for:  severe uncontrolled pain   Complete by:  As directed    Increase activity slowly   Complete by:  As directed      Allergies as of 01/29/2018      Reactions   Morphine And Related Hives      Medication List    TAKE these medications   blood glucose meter kit and supplies Kit Dispense based on patient and insurance preference. Use up to four times daily as directed. (FOR ICD-9 250.00, 250.01).   escitalopram 10 MG tablet Commonly known as:  LEXAPRO Take 10 mg by mouth daily.   feeding supplement (ENSURE ENLIVE) Liqd Take 237 mLs by mouth 2 (two) times daily between meals.   fentaNYL 12 MCG/HR Commonly known as:  DURAGESIC - dosed mcg/hr Place 25 mcg onto the skin every 3 (three) days.   HYDROcodone-acetaminophen 5-325 MG tablet Commonly known as:  NORCO/VICODIN Take 1 tablet by mouth every 6 (six) hours as needed for moderate pain.   insulin degludec 100 UNIT/ML Sopn FlexTouch Pen Commonly known as:  TRESIBA Inject 0.26 mLs (26 Units total) into the skin daily.   insulin regular 100 units/mL injection Commonly known as:  NOVOLIN R,HUMULIN R Inject 0.01-0.1 mLs (1-10 Units total) into the skin 3 (three) times daily before meals. Sliding scale   lipase/protease/amylase 12000 units Cpep capsule Commonly known as:  CREON Take 2 capsules (24,000 Units total) by mouth 3 (three) times daily.   nicotine 14 mg/24hr patch Commonly known as:  NICODERM CQ -  dosed in mg/24 hours Place 1 patch (14 mg total) onto the skin daily.   ondansetron 4 MG disintegrating tablet Commonly known as:  ZOFRAN-ODT 84m ODT q4 hours prn nausea/vomit   pantoprazole 40 MG tablet Commonly known as:  PROTONIX Take 40 mg by mouth daily.   potassium  chloride 10 MEQ tablet Commonly known as:  K-DUR Take 1 tablet (10 mEq total) by mouth daily.   pregabalin 50 MG capsule Commonly known as:  LYRICA Take 50 mg by mouth 3 (three) times daily.   promethazine 12.5 MG tablet Commonly known as:  PHENERGAN Take 12.5 mg by mouth every 6 (six) hours as needed for nausea or vomiting.      Follow-up [place] Schedule an appointment as soon as possible for a visit in 1 week(s).   Specialty:  Internal Medicine Why:  Primary care and hospital follow-up Contact information: [id number]      MClarene Essex MD. Schedule an appointment as soon as possible for a visit in 2 week(s).   Specialty:  Gastroenterology Why:  Biliary/pancreatic stent, chronic pancreatitis Contact information: [id number] [id number]         Allergies  Allergen Reactions  . Morphine And Related Hives    Consultations:  [facility]   Procedures/Studies: UKoreaAbdomen Complete  Result Date: 01/15/2018 CLINICAL DATA:  Right upper quadrant abdominal pain. Acute on chronic pancreatitis on a recent abdomen and pelvis CT. There were multiple small loculated fluid collections along the course of the body and head of the pancreas on the CT with pancreatic ductal dilatation and a pancreatic stent in place. There is also a biliary stent with biliary ductal dilatation status post cholecystectomy. EXAM: ABDOMEN ULTRASOUND COMPLETE COMPARISON:  Abdomen and pelvis CT dated 01/13/2018. FINDINGS: Gallbladder: Surgically absent. Common bile duct: Diameter: 6.0 mm Liver: Intrahepatic biliary ductal dilatation, unchanged. Normal echotexture. Portal vein is patent on color Doppler imaging with normal direction of blood flow towards the liver. IVC: No abnormality visualized. Pancreas: Obscured by overlying bowel gas.  Spleen: Size and appearance within normal limits. Right Kidney: Length: 11.4 cm. Echogenicity within normal limits. No mass or hydronephrosis visualized. Left Kidney: Length: 11.3 cm. Echogenicity within normal limits. No mass or hydronephrosis visualized. Abdominal aorta: No aneurysm visualized. Other findings: None. IMPRESSION: 1. No acute abnormality. 2. Stable mild biliary ductal dilatation, status post cholecystectomy. Electronically Signed   By: SClaudie ReveringM.D.   On: 01/15/2018 20:04   Ct Abdomen Pelvis W Contrast  Result Date: 01/25/2018 CLINICAL DATA:  Abdominal pain, history of recurrent pancreatitis EXAM: CT ABDOMEN AND PELVIS WITH CONTRAST TECHNIQUE: Multidetector CT imaging of the abdomen and pelvis was performed using the standard protocol following bolus administration of intravenous contrast. CONTRAST:  1034mISOVUE-300 IOPAMIDOL (ISOVUE-300) INJECTION 61% COMPARISON:  01/13/2018 FINDINGS: Lower chest: Minor dependent atelectasis/hyperventilation. No lower lobe pneumonia. Normal heart size. No pericardial pleural effusion. Hepatobiliary: Stable diffuse biliary dilatation. Remote cholecystectomy noted. Endoscopic pancreatic and common bile duct stents are stable in position. No new focal hepatic abnormality. Pancreas: Similar appearance of diffuse pancreatic edema with decreased parenchymal enhancement. Diffuse coarse pancreatic calcifications. Stable pancreatic ductal dilatation with a proximal pancreatic stent extending into the duodenum. Similar peripancreatic mild strandy edema/fluid. Multifocal pancreatic loculated fluid collections as before, largest in the pancreatic head region is 2.0 cm, previously 2.4 cm. No new fluid collections.  Overall stable appearance of acute on chronic pancreatitis. No significant interval change or new finding. Spleen: Normal in size without focal abnormality. Adrenals/Urinary Tract: Normal adrenal glands. No acute renal obstruction, hydronephrosis, hydroureter,  or obstructing ureteral calculus. Urinary bladder unremarkable. 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 Stomach/Bowel: Negative for bowel obstruction, significant dilatation, ileus, or free air. Normal appendix demonstrated. Moderate colonic stool burden. No fluid collection or abscess. Overall stable appearance. Vascular/Lymphatic: Aortic atherosclerosis noted without occlusive process or aneurysm. No retroperitoneal hemorrhage. Mesenteric and renal vasculature remain patent. No adenopathy. Stable enlargement of the portal vein and mesenteric veins without portal vein occlusion or thrombus. 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 Reproductive: Uterus normal in size. Right ovary and adnexa unremarkable. Left ovary contains a hypodense cyst measuring 5.1 cm. Other: No abdominal wall hernia or abnormality. No abdominopelvic ascites. 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 Stable pancreatic duct dilatation and diffuse biliary dilatation with unchanged endoscopic common bile duct and pancreatic stents. No new fluid collection or abscess. 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 Portal vein and mesenteric veins remain patent. 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  Shick M.D.   On: 01/25/2018 19:27   Ct Abdomen Pelvis W Contrast  Result Date: 01/13/2018 CLINICAL DATA:  Abdominal pain and hyperglycemia. History of pancreatitis. Elevated blood sugar. EXAM: CT ABDOMEN AND  PELVIS WITH CONTRAST TECHNIQUE: Multidetector CT imaging of the abdomen and pelvis was performed using the standard protocol following bolus administration of intravenous contrast. CONTRAST:  165m ISOVUE-300 IOPAMIDOL (ISOVUE-300) INJECTION 61% COMPARISON:  None. FINDINGS: Lower chest: Mild dependent changes in the lung bases. Hepatobiliary: Surgical absence of the gallbladder. There is mild bile duct dilatation with a stent in the distal common bile duct. No focal liver lesions. Pancreas: Diffuse pancreatic edema with decreased parenchymal enhancement throughout the pancreas. Pancreatic ductal dilatation with a pancreatic stent in place. Peripancreatic edema. Multiple small loculated fluid collections along the course of the body and head of the pancreas. Largest is anterior to the head and measures about 2.3 cm in diameter. Multiple pancreatic calcifications. Changes are consistent with acute on chronic pancreatitis with evidence of parenchymal necrosis and areas of head off necrosis. Spleen: Normal in size without focal abnormality. Adrenals/Urinary Tract: No adrenal gland nodules. Kidneys are symmetrical. Small cyst on the left kidney. Nephrograms are homogeneous. No hydronephrosis or hydroureter. Bladder is mildly distended without wall thickening or filling defect. This could be physiologic or due to urinary retention. Stomach/Bowel: Stomach, small bowel, and colon are not abnormally distended. Gas and stool throughout the colon. No wall thickening or inflammatory changes are appreciated. Appendix is normal. Vascular/Lymphatic: Normal caliber abdominal aorta with minimal calcification. Retroperitoneal lymph nodes are mildly prominent with periaortic nodes measuring up to about 1.3 cm in maximal diameter. This is nonspecific but likely reactive. Reproductive: Uterus is not enlarged. Left ovarian simple appearing cyst measuring 5.1 cm in diameter. Subcentimeter cyst in the vaginal region likely Bartholin cyst.  Other: No free air or free fluid in the abdomen. Abdominal wall musculature appears intact. Musculoskeletal: No acute or significant osseous findings. IMPRESSION: 1. Changes of  acute on chronic pancreatitis with multiple small loculated fluid collections along the course of the body and head of the pancreas. Pancreatic ductal dilatation with pancreatic stent in place. 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 2. Surgical absence of the gallbladder with mild bile duct dilatation. Bile duct stent in place. 3. Mild retroperitoneal lymphadenopathy, likely reactive. 4. 5.1 cm simple appearing cyst in the left ovary. Due to borderline size, consider ultrasound follow-up in 6-12 weeks. Electronically Signed   By: Lucienne Capers M.D.   On: 01/13/2018 21:24   Dg Ercp  Result Date: 01/27/2018 CLINICAL DATA:  37 year old female with a history of pancreatitis, pancreatic stents. EXAM: ERCP TECHNIQUE: Multiple spot images obtained with the fluoroscopic device and submitted for interpretation post-procedure. FLUOROSCOPY TIME:  Fluoroscopy Time:  3 minutes 35 seconds COMPARISON:  CT 01/25/2018, 01/13/2018 FINDINGS: Limited images during ERCP. Initial image demonstrates endoscope projecting over the upper abdomen. Plastic biliary stent in position with surgical changes of cholecystectomy. There is partial opacification of the extrahepatic biliary ducts, with placement of metallic biliary stent and evacuation of the contrast. IMPRESSION: Limited images during ERCP demonstrates partial opacification of the extrahepatic biliary ducts, removal of the plastic biliary stent, and placement of a metal biliary stent. Please refer to the dictated operative report for full details of intraoperative findings and procedure. Electronically Signed   By: Corrie Mckusick D.O.   On: 01/27/2018 13:04      10/3: ERCP Impression: - Two visibly occluded stents from the biliary tree   and the pancreatic duct were seen in the major  papilla. - The major papilla appeared edematous. - One stent was removed from the biliary tree. - The biliary tree was swept and sludge was found. - One stent was removed from the pancreatic duct. - One covered metal stent was placed into the  common bile duct.  Recommendation: - Clear liquid diet for 6 hours. May slowly advance  later today if doing well using low-fat diet - Continue present medications. - Return to GI clinic PRN. Might need follow-up at  [facility] to reevaluate the pancreas and see if further  pancreatic stenting or other pancreatic options are  needed - Telephone GI clinic if symptomatic PRN. - Check liver enzymes (AST, ALT, alkaline  phosphatase, bilirubin) and hemogram with white  blood cell count and platelets tomorrow.   Subjective: Abdominal pain improved and is more like her baseline pain.  Discharge Exam: Vitals:   01/28/18 2122 01/29/18 0532  BP: 113/73 (!) 124/94  Pulse: 69 71  Resp: 16 13  Temp: 99.1 F (37.3 C) 98.8 F (37.1 C)  SpO2: 97% 98%   Vitals:   01/28/18 0506 01/28/18 1716 01/28/18 2122 01/29/18 0532  BP: 100/66 104/74 113/73 (!) 124/94  Pulse: 63 60 69 71  Resp: _0 Temp: 97.9 F (36.6 C) 98.9 F (37.2 C) 99.1 F (37.3 C) 98.8 F (37.1 C)  TempSrc: Oral Oral Oral Oral  SpO2: 98% 98% 97% 98%  Weight:      Height:        General: Pt is alert, awake, not in acute distress Cardiovascular: RRR, S1/S2 +, no  rubs, no gallops Respiratory: CTA bilaterally, no wheezing, no rhonchi Abdominal: Soft, mildly tender, ND, bowel sounds + Extremities: no edema, no cyanosis    The results of significant diagnostics from this hospitalization (including imaging, microbiology, ancillary and laboratory) are listed below for reference.     Microbiology: No results found for this  or any previous visit (from the past 240 hour(s)).   Labs: BNP (last 3 results) No results for input(s): BNP in the last 8760 hours. Basic Metabolic Panel: Recent Labs  Lab 01/25/18 1620 01/25/18 2103 01/26/18 0409 01/27/18 0803 01/28/18 0436  NA 133*  --  144 142 144  K 3.7  --  3.1* 3.5 3.4*  CL 97*  --  110 113* 110  CO2 25  --  _0 GLUCOSE 373*  --  118* 67* 83  BUN 8  --  7 <5* 6  CREATININE 0.52  --  0.35* 0.32* 0.35*  CALCIUM 8.9  --  8.4* 8.2* 8.6*  MG  --  1.7 1.9  --   --   PHOS  --  2.7 2.7  --   --    Liver Function Tests: Recent Labs  Lab 01/25/18 1620 01/26/18 0409 01/27/18 0803 01/28/18 0436  AST 253* 178* 76* 40  ALT 197* 154* 105* 82*  ALKPHOS 825* 764* 650* 642*  BILITOT 1.3* 0.9 0.8 0.7  PROT 7.1 5.8* 5.3* 5.7*  ALBUMIN 3.5 2.9* 2.5* 2.6*   Recent Labs  Lab 01/25/18 1620  LIPASE 109*   No results for input(s): AMMONIA in the last 168 hours. CBC: Recent Labs  Lab 01/25/18 1620 01/26/18 0409 01/27/18 0803 01/28/18 0436  WBC 9.2 5.0 5.0 6.9  NEUTROABS 7.4  --   --  4.4  HGB 12.9 11.7* 11.8* 11.7*  HCT 38.5 35.6* 36.9 35.7*  MCV 90.0 90.8 91.3 91.5  PLT 163 145* 139* 131*   Cardiac Enzymes: Recent Labs  Lab 01/25/18 1620  CKTOTAL 50   BNP: Invalid input(s): POCBNP CBG: Recent Labs  Lab 01/28/18 0832 01/28/18 1213 01/28/18 1721 01/28/18 2123 01/29/18 0741  GLUCAP 77 208* 167* 224* 256*   D-Dimer No results for input(s): DDIMER in the last 72 hours. Hgb A1c No results for input(s): HGBA1C in the last 72 hours. Lipid Profile No results for input(s):  CHOL, HDL, LDLCALC, TRIG, CHOLHDL, LDLDIRECT in the last 72 hours. Thyroid function studies No results for input(s): TSH, T4TOTAL, T3FREE, THYROIDAB in the last 72 hours.  Invalid input(s): FREET3 Anemia work up No results for input(s): VITAMINB12, FOLATE, FERRITIN, TIBC, IRON, RETICCTPCT in the last 72 hours. Urinalysis    Component Value Date/Time   COLORURINE YELLOW 01/25/2018 1649   APPEARANCEUR CLEAR 01/25/2018 1649   LABSPEC 1.032 (H) 01/25/2018 1649   PHURINE 8.0 01/25/2018 1649   GLUCOSEU >=500 (A) 01/25/2018 1649   HGBUR NEGATIVE 01/25/2018 1649   BILIRUBINUR NEGATIVE 01/25/2018 1649   KETONESUR NEGATIVE 01/25/2018 1649   PROTEINUR NEGATIVE 01/25/2018 1649   NITRITE NEGATIVE 01/25/2018 1649   LEUKOCYTESUR NEGATIVE 01/25/2018 1649     SIGNED:   Cordelia Poche, MD [facility] 01/29/2018, 9:46 AM

## 2018-01-29 NOTE — Progress Notes (Signed)
Nurse reviewed discharge instructions with pt.  Pt verbalized understanding of discharge instructions, follow up appointments and new medication.  No concerns at time of discharge. 

## 2018-01-29 NOTE — Discharge Instructions (Signed)
Heather Hunt,  You were admitted for pancreatitis. This was secondary to occluded (blocked) stents. One stent was replaced and the other was cleaned. Please follow-up with GI as an outpatient. Also, I have included information for you to see a primary care physician, or, you can call your insurance company to find a primary care physician.

## 2018-02-07 ENCOUNTER — Emergency Department (HOSPITAL_COMMUNITY): Payer: BLUE CROSS/BLUE SHIELD

## 2018-02-07 ENCOUNTER — Emergency Department (HOSPITAL_COMMUNITY)
Admission: EM | Admit: 2018-02-07 | Discharge: 2018-02-08 | Disposition: A | Discharge status: Home / Self Care | Payer: BLUE CROSS/BLUE SHIELD | Attending: Emergency Medicine | Admitting: Emergency Medicine

## 2018-02-07 ENCOUNTER — Other Ambulatory Visit: Payer: Self-pay

## 2018-02-07 ENCOUNTER — Encounter (HOSPITAL_COMMUNITY): Payer: Self-pay

## 2018-02-07 DIAGNOSIS — Z794 Long term (current) use of insulin: Secondary | ICD-10-CM | POA: Insufficient documentation

## 2018-02-07 DIAGNOSIS — K529 Noninfective gastroenteritis and colitis, unspecified: Secondary | ICD-10-CM | POA: Diagnosis not present

## 2018-02-07 DIAGNOSIS — K860 Alcohol-induced chronic pancreatitis: Secondary | ICD-10-CM | POA: Diagnosis not present

## 2018-02-07 DIAGNOSIS — F17210 Nicotine dependence, cigarettes, uncomplicated: Secondary | ICD-10-CM | POA: Insufficient documentation

## 2018-02-07 DIAGNOSIS — Z79899 Other long term (current) drug therapy: Secondary | ICD-10-CM | POA: Diagnosis not present

## 2018-02-07 DIAGNOSIS — E119 Type 2 diabetes mellitus without complications: Secondary | ICD-10-CM | POA: Diagnosis not present

## 2018-02-07 DIAGNOSIS — R1011 Right upper quadrant pain: Secondary | ICD-10-CM | POA: Diagnosis present

## 2018-02-07 LAB — CBC
HCT: 42 % (ref 36.0–46.0)
HEMOGLOBIN: 13.8 g/dL (ref 12.0–15.0)
MCH: 29.7 pg (ref 26.0–34.0)
MCHC: 32.9 g/dL (ref 30.0–36.0)
MCV: 90.3 fL (ref 80.0–100.0)
Platelets: 217 10*3/uL (ref 150–400)
RBC: 4.65 MIL/uL (ref 3.87–5.11)
RDW: 14.9 % (ref 11.5–15.5)
WBC: 11.1 10*3/uL — ABNORMAL HIGH (ref 4.0–10.5)
nRBC: 0 % (ref 0.0–0.2)

## 2018-02-07 LAB — COMPREHENSIVE METABOLIC PANEL
ALBUMIN: 4 g/dL (ref 3.5–5.0)
ALK PHOS: 312 U/L — AB (ref 38–126)
ALT: 21 U/L (ref 0–44)
AST: 24 U/L (ref 15–41)
Anion gap: 13 (ref 5–15)
BILIRUBIN TOTAL: 0.5 mg/dL (ref 0.3–1.2)
BUN: 10 mg/dL (ref 6–20)
CO2: 27 mmol/L (ref 22–32)
CREATININE: 0.58 mg/dL (ref 0.44–1.00)
Calcium: 9.3 mg/dL (ref 8.9–10.3)
Chloride: 94 mmol/L — ABNORMAL LOW (ref 98–111)
GFR calc Af Amer: >60 mL/min (ref >60)
GFR calc non Af Amer: >60 mL/min (ref >60)
GLUCOSE: 496 mg/dL — AB (ref 70–99)
Potassium: 3.9 mmol/L (ref 3.5–5.1)
Sodium: 134 mmol/L — ABNORMAL LOW (ref 135–145)
TOTAL PROTEIN: 7.9 g/dL (ref 6.5–8.1)

## 2018-02-07 LAB — URINALYSIS, ROUTINE W REFLEX MICROSCOPIC
BILIRUBIN URINE: NEGATIVE
Bacteria, UA: NONE SEEN
Glucose, UA: >=500 mg/dL — AB
Hgb urine dipstick: NEGATIVE
Ketones, ur: NEGATIVE mg/dL
Leukocytes, UA: NEGATIVE
Nitrite: NEGATIVE
PH: 7 (ref 5.0–8.0)
Protein, ur: NEGATIVE mg/dL
SPECIFIC GRAVITY, URINE: 1.036 — AB (ref 1.005–1.030)

## 2018-02-07 LAB — I-STAT BETA HCG BLOOD, ED (MC, WL, AP ONLY): I-stat hCG, quantitative: <5 m[IU]/mL (ref <5)

## 2018-02-07 LAB — LIPASE, BLOOD: Lipase: 177 U/L — ABNORMAL HIGH (ref 11–51)

## 2018-02-07 MED ORDER — SODIUM CHLORIDE 0.9 % IJ SOLN
INTRAMUSCULAR | Status: AC
  Filled 2018-02-07: qty 50

## 2018-02-07 MED ORDER — FAMOTIDINE IN NACL 20-0.9 MG/50ML-% IV SOLN
20.0000 mg | INTRAVENOUS | Status: AC
  Administered 2018-02-07: 20 mg via INTRAVENOUS
  Filled 2018-02-07: qty 50

## 2018-02-07 MED ORDER — IOPAMIDOL (ISOVUE-300) INJECTION 61%
INTRAVENOUS | Status: AC
  Filled 2018-02-07: qty 100

## 2018-02-07 MED ORDER — HYDROMORPHONE HCL 1 MG/ML IJ SOLN
1.0000 mg | Freq: Once | INTRAMUSCULAR | Status: AC
  Administered 2018-02-07: 1 mg via INTRAVENOUS
  Filled 2018-02-07: qty 1

## 2018-02-07 MED ORDER — SODIUM CHLORIDE 0.9 % IV BOLUS
1000.0000 mL | Freq: Once | INTRAVENOUS | Status: AC
  Administered 2018-02-07: 1000 mL via INTRAVENOUS

## 2018-02-07 MED ORDER — ONDANSETRON HCL 4 MG/2ML IJ SOLN
4.0000 mg | Freq: Once | INTRAMUSCULAR | Status: AC
  Administered 2018-02-07: 4 mg via INTRAVENOUS
  Filled 2018-02-07: qty 2

## 2018-02-07 MED ORDER — IOPAMIDOL (ISOVUE-300) INJECTION 61%
100.0000 mL | Freq: Once | INTRAVENOUS | Status: AC | PRN
  Administered 2018-02-07: 100 mL via INTRAVENOUS

## 2018-02-07 NOTE — ED Provider Notes (Signed)
[facility] DEPT Provider Note   CSN: [id number] Arrival date & time: 02/07/18  1640     History   Chief Complaint Chief Complaint  Patient presents with  . Abdominal Pain    HPI Heather Hunt is a 37 y.o. female.  The history is provided by the patient and medical records.  Abdominal Pain   Associated symptoms include nausea.     37 y.o. F with hx of chronic pancreatitis due to alcohol abuse, DM, presenting to the ED with abdominal pain.  Patient reports she was admitted 01/25/18 and underwent ERCP on 01/27/18 with pancreatic stent removal and CBD stent exchange.  States initially she was doing very well, had ongoing pain with eating but that was unchanged from prior.  States pain got bad on Friday, 3 days ago.  States she has been able to eat and drink minimal amounts and has a lot of pain and burning sensation when doing she reports nausea but denies vomiting.  Stools have been "oily".  States she is not scheduled to see GI for another week.  She denies any fever or chills.  She has not had any chest pain or shortness of breath.  Patient does report her blood sugars have been elevated at home at times over 500.  Past Medical History:  Diagnosis Date  . Diabetes mellitus without complication ([place])   . Pancreatitis   . Pancreatitis     Patient Active Problem List   Diagnosis Date Noted  . Transaminitis 01/25/2018  . Pancreatitis 01/25/2018  . Dehydration 01/25/2018  . Hyperglycemia 01/25/2018  . Acute on chronic pancreatitis ([place]) 01/15/2018  . IDDM (insulin dependent diabetes mellitus) ([place]) 01/15/2018    Past Surgical History:  Procedure Laterality Date  . BILE DUCT STENT PLACEMENT    . BILIARY STENT PLACEMENT N/A 01/27/2018   Procedure: BILIARY STENT PLACEMENT;  Surgeon: Clarene Essex, MD;  Location: [facility];  Service: Endoscopy;  Laterality: N/A;  . CHOLECYSTECTOMY    . ERCP N/A 01/27/2018   Procedure: ENDOSCOPIC RETROGRADE  CHOLANGIOPANCREATOGRAPHY (ERCP);  Surgeon: Clarene Essex, MD;  Location: [facility];  Service: Endoscopy;  Laterality: N/A;  . KNEE SURGERY    . pancreas stent    . STENT REMOVAL  01/27/2018   Procedure: STENT REMOVAL;  Surgeon: Clarene Essex, MD;  Location: [facility];  Service: Endoscopy;;  . SYMPATHECTOMY       OB History   None      Home Medications    Prior to Admission medications   Medication Sig Start Date End Date Taking? Authorizing Provider  blood glucose meter kit and supplies KIT Dispense based on patient and insurance preference. Use up to four times daily as directed. (FOR ICD-9 250.00, 250.01). 12/07/17  Yes Waynetta Pean, PA-C  escitalopram (LEXAPRO) 10 MG tablet Take 10 mg by mouth daily.    Yes [provider]  feeding supplement, ENSURE ENLIVE, (ENSURE ENLIVE) LIQD Take 237 mLs by mouth 2 (two) times daily between meals. 01/29/18  Yes Mariel Aloe, MD  fentaNYL (DURAGESIC - DOSED MCG/HR) 12 MCG/HR Place 25 mcg onto the skin every 3 (three) days.    Yes [provider]  HYDROcodone-acetaminophen (NORCO/VICODIN) 5-325 MG tablet Take 1 tablet by mouth every 6 (six) hours as needed for moderate pain. 01/13/18  Yes Milton Ferguson, MD  insulin degludec (TRESIBA FLEXTOUCH) 100 UNIT/ML SOPN FlexTouch Pen Inject 0.26 mLs (26 Units total) into the skin daily. 01/17/18  Yes Kayleen Memos, DO  insulin regular (HUMULIN R) 100 units/mL injection Inject 0.01-0.1 mLs (1-10 Units total) into the skin 3 (three) times daily before meals. Sliding scale 01/17/18  Yes Kayleen Memos, DO  lipase/protease/amylase (CREON) 12000 units CPEP capsule Take 2 capsules (24,000 Units total) by mouth 3 (three) times daily. 01/17/18  Yes Irene Pap N, DO  ondansetron (ZOFRAN ODT) 4 MG disintegrating tablet '4mg'$  ODT q4 hours prn nausea/vomit 01/13/18  Yes Milton Ferguson, MD  pantoprazole (PROTONIX) 40 MG tablet Take 40 mg by mouth daily. 10/12/17  Yes [provider]  potassium  chloride (K-DUR) 10 MEQ tablet Take 1 tablet (10 mEq total) by mouth daily. 01/13/18  Yes Milton Ferguson, MD  pregabalin (LYRICA) 50 MG capsule Take 50 mg by mouth 3 (three) times daily.   Yes [provider]  promethazine (PHENERGAN) 12.5 MG tablet Take 12.5 mg by mouth every 6 (six) hours as needed for nausea or vomiting.  11/11/17  Yes [provider]  nicotine (NICODERM CQ - DOSED IN MG/24 HOURS) 14 mg/24hr patch Place 1 patch (14 mg total) onto the skin daily. Patient not taking: Reported on 01/25/2018 01/18/18   Kayleen Memos, DO    Family History Family History  Problem Relation Age of Onset  . Cancer Father   . Lung cancer Father   . Diabetes Neg Hx   . Stroke Neg Hx   . CAD Neg Hx     Social History Social History   Tobacco Use  . Smoking status: Current Every Day Smoker    Packs/day: 0.50    Types: Cigarettes  . Smokeless tobacco: Never Used  Substance Use Topics  . Alcohol use: Not Currently  . Drug use: Never     Allergies   Morphine and related   Review of Systems Review of Systems  Gastrointestinal: Positive for abdominal pain and nausea.  All other systems reviewed and are negative.    Physical Exam Updated Vital Signs BP 103/80 (BP Location: Left Arm)   Pulse 74   Temp 98.5 F (36.9 C) (Oral)   Resp 18   Ht '5\' 6"'$  (1.676 m)   Wt 59 kg   SpO2 99%   BMI 20.98 kg/m   Physical Exam  Constitutional: She is oriented to person, place, and time. She appears well-developed and well-nourished.  Appears uncomfortable, somewhat chronically ill appearing  HENT:  Head: Normocephalic and atraumatic.  Mouth/Throat: Oropharynx is clear and moist.  Eyes: Pupils are equal, round, and reactive to light. Conjunctivae and EOM are normal.  Neck: Normal range of motion.  Cardiovascular: Normal rate, regular rhythm and normal heart sounds.  Pulmonary/Chest: Effort normal and breath sounds normal.  Abdominal: Soft. Bowel sounds are normal. There is  tenderness in the epigastric area.  Musculoskeletal: Normal range of motion.  Neurological: She is alert and oriented to person, place, and time.  Skin: Skin is warm and dry.  Psychiatric: She has a normal mood and affect.  Nursing note and vitals reviewed.    ED Treatments / Results  Labs (all labs ordered are listed, but only abnormal results are displayed) Labs Reviewed  LIPASE, BLOOD - Abnormal; Notable for the following components:      Result Value   Lipase 177 (*)    All other components within normal limits  COMPREHENSIVE METABOLIC PANEL - Abnormal; Notable for the following components:   Sodium 134 (*)    Chloride 94 (*)    Glucose, Bld 496 (*)    Alkaline Phosphatase  312 (*)    All other components within normal limits  CBC - Abnormal; Notable for the following components:   WBC 11.1 (*)    All other components within normal limits  URINALYSIS, ROUTINE W REFLEX MICROSCOPIC - Abnormal; Notable for the following components:   Specific Gravity, Urine 1.036 (*)    Glucose, UA >=500 (*)    All other components within normal limits  I-STAT BETA HCG BLOOD, ED ([facility], [facility], [facility] ONLY)    EKG None  Radiology Ct Abdomen Pelvis W Contrast  Result Date: 02/07/2018 CLINICAL DATA:  Right upper quadrant pain times 10 days ago. History of biliary stent placed 3 days ago in removal a pancreatic duct stent. History of pancreatitis. History of diarrhea and nausea. EXAM: CT ABDOMEN AND PELVIS WITH CONTRAST TECHNIQUE: Multidetector CT imaging of the abdomen and pelvis was performed using the standard protocol following bolus administration of intravenous contrast. CONTRAST:  178m ISOVUE-300 IOPAMIDOL (ISOVUE-300) INJECTION 61% COMPARISON:  01/25/2018 CT FINDINGS: Lower chest: Normal heart size.  Dependent bibasilar atelectasis. Hepatobiliary: Interval removal of pancreatic duct stent with common bowel duct stent noted in the CBD with distal end in the duodenum. Pneumobilia is identified from  intervention. No space-occupying mass of the liver. Status post cholecystectomy. Pancreas: Hypodense edematous appearance of the pancreas with scattered coarse calcifications consistent with acute on chronic pancreatitis. Chronic stable mild pancreatic ductal dilatation though somewhat difficult to identify given its low attenuation and current edematous appearance of the pancreatic gland. Smaller peripancreatic fluid collections in cysts near the pancreatic head. Spleen: Normal size spleen Adrenals/Urinary Tract: Adrenal glands are unremarkable. 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 This measures approximately 1 cm. Bladder is unremarkable. Stomach/Bowel: Diffuse small bowel fluid-filled distention with mural and fold thickening suspicious for changes of small bowel enteritis. No mechanical bowel obstruction. Average amount of fecal retention within the colon. The stomach is unremarkable. The appendix is normal. Vascular/Lymphatic: Aortic atherosclerosis. No aneurysm. Patent branch vessels. No portal vein thrombosis. 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 No lymphadenopathy. Reproductive: Uterus is normal. Right ovary and adnexa are unremarkable. There is a stable approximately 5 cm cyst in the left adnexa. Other: No abdominal wall hernia.  No abdominopelvic ascites. Musculoskeletal: No acute osseous finding. IMPRESSION: Mild fluid-filled distention of small intestine with fold and mucosal enhancement and thickening. Findings suggest a small bowel enteritis. Common bowel duct stent noted with pneumobilia from prior intervention. Removal of pancreatic duct stent since prior. Acute on chronic pancreatitis with edematous appearance of the pancreatic gland superimposed on chronic pancreatic calcifications. Smaller fluid collections and/or cystic foci about the pancreatic head since prior. 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 This measures 1 cm in diameter. Stable approximately 5 cm simple appearing left ovarian cyst. Electronically Signed   By: DAshley RoyaltyM.D.   On: 02/07/2018 23:40    Procedures Procedures (including critical care time)  Medications Ordered in ED Medications  iopamidol (ISOVUE-300) 61 % injection (has no administration in time range)  sodium chloride 0.9 % injection (has no administration in time range)  HYDROmorphone (DILAUDID) injection 1 mg (1 mg Intravenous Given 02/07/18 2258)  sodium chloride 0.9 % bolus 1,000 mL (0 mLs Intravenous Stopped 02/07/18 2314)  famotidine (PEPCID) IVPB 20 mg premix (0 mg Intravenous Stopped 02/07/18 2332)  ondansetron (ZOFRAN) injection 4 mg (4 mg Intravenous Given 02/07/18 2258)  iopamidol (ISOVUE-300) 61 %  injection 100 mL (100 mLs Intravenous Contrast Given 02/07/18 2318)  HYDROmorphone (DILAUDID) injection 1 mg (1 mg Intravenous Given 02/08/18 0023)  gi cocktail (Maalox,Lidocaine,Donnatal) (30 mLs Oral Given 02/08/18 0110)     Initial Impression / Assessment and Plan / ED Course  I have reviewed the triage vital signs and the nursing notes.  Pertinent labs & imaging results that were available during my care of the patient were reviewed by me and considered in my medical decision making (see chart for details).  37 year old female here with abdominal pain.  Recent admission for acute on chronic pancreatitis.  Underwent ERCP on 01/27/2018 for pancreatic stent removal as well as CBD stent exchange.  States she was doing well until about 3 days ago when she had acute worsening of her pain.  Reports nausea but denies vomiting.  Reports loose, oily stools.  She is afebrile and nontoxic.  She does appear uncomfortable and is somewhat chronically ill-appearing.  Tenderness in the epigastrium without peritoneal signs.  Labs reviewed, she has hyperglycemic without findings of DKA.  States her  sugars were actually higher than this at home.  Her LFTs returned to normal, however lipase is started elevating.  Today at 177, was 107 previously-- possibly from recent manipulation/stenting, however will repeat CT scan to ensure no complications.  Patient was given IV fluids as well as pain and nausea medication.  Will monitor closely.  CT scan with findings of acute small bowel enteritis.  No evidence of obstruction.  Continues to have chronic inflammatory changes of the pancreas as well as what appears to be pseudocyst, largely unchanged from prior.  Patient has improved with IV hydration and IV medications here.  Overall she appears to be feeling better.  VSS.  She has been able to tolerate Sprite without any difficulty.  Does report some ongoing acid reflux type symptoms, given additional GI cocktail with some improvement.  At this point without any significant findings aside from enteritis and improved clinical appearance, do not feel she needs ongoing evaluation/admission.  Recommended continued supportive care at home with good oral hydration, gentle diet and advance as tolerated.  She is enrolled in pain management so will continue those meds, I have given her script for zofran.  She will FU with GI next Monday as scheduled.  She will return here for any new/acute changes.  Final Clinical Impressions(s) / ED Diagnoses   Final diagnoses:  Enteritis  Alcohol-induced chronic pancreatitis [facility])    ED Discharge Orders         Ordered    ondansetron (ZOFRAN ODT) 4 MG disintegrating tablet  Every 8 hours PRN     02/08/18 0118           Larene Pickett, PA-C 02/08/18 0207    Margette Fast, MD 02/08/18 [phone]

## 2018-02-07 NOTE — ED Triage Notes (Signed)
Patient c/o RUQ abdominal pain 10 days ago and patient states she had a biliary stent placed 3 days ago and a removal of a pancreatic duct stent. Patent has a history of pancreatitis. Patient c/o "oily " diarrhea and nausea also.

## 2018-02-08 MED ORDER — ONDANSETRON 4 MG PO TBDP: 4.0000 mg | ORAL_TABLET | Freq: Three times a day (TID) | ORAL | 0 refills | Status: AC | PRN

## 2018-02-08 MED ORDER — GI COCKTAIL ~~LOC~~
30.0000 mL | Freq: Once | ORAL | Status: AC
  Administered 2018-02-08: 30 mL via ORAL

## 2018-02-08 MED ORDER — GI COCKTAIL ~~LOC~~
ORAL | Status: AC
  Filled 2018-02-08: qty 30

## 2018-02-08 MED ORDER — HYDROMORPHONE HCL 1 MG/ML IJ SOLN
1.0000 mg | Freq: Once | INTRAMUSCULAR | Status: AC
  Administered 2018-02-08: 1 mg via INTRAVENOUS
  Filled 2018-02-08: qty 1

## 2018-02-08 NOTE — ED Notes (Signed)
Provided patient Sprite with ice. Permission given by Misty Stanley, PA.

## 2018-02-08 NOTE — Discharge Instructions (Signed)
Follow up with your GI doctor next week as scheduled. Continue your home medications. Make sure to drink fluids, gentle diet and advance as tolerated. Return here for any new/acute changes.

## 2018-02-21 ENCOUNTER — Emergency Department (HOSPITAL_COMMUNITY)
Admission: EM | Admit: 2018-02-21 | Discharge: 2018-02-21 | Discharge status: Against Medical Advice | Payer: BLUE CROSS/BLUE SHIELD

## 2018-03-14 ENCOUNTER — Emergency Department (HOSPITAL_COMMUNITY)
Admission: EM | Admit: 2018-03-14 | Discharge: 2018-03-15 | Disposition: A | Discharge status: Home / Self Care | Payer: BLUE CROSS/BLUE SHIELD | Attending: Emergency Medicine | Admitting: Emergency Medicine

## 2018-03-14 ENCOUNTER — Other Ambulatory Visit: Payer: Self-pay

## 2018-03-14 ENCOUNTER — Encounter (HOSPITAL_COMMUNITY): Payer: Self-pay | Admitting: Emergency Medicine

## 2018-03-14 DIAGNOSIS — F17210 Nicotine dependence, cigarettes, uncomplicated: Secondary | ICD-10-CM | POA: Insufficient documentation

## 2018-03-14 DIAGNOSIS — E119 Type 2 diabetes mellitus without complications: Secondary | ICD-10-CM | POA: Diagnosis not present

## 2018-03-14 DIAGNOSIS — E109 Type 1 diabetes mellitus without complications: Secondary | ICD-10-CM | POA: Insufficient documentation

## 2018-03-14 DIAGNOSIS — K860 Alcohol-induced chronic pancreatitis: Secondary | ICD-10-CM | POA: Insufficient documentation

## 2018-03-14 DIAGNOSIS — R112 Nausea with vomiting, unspecified: Secondary | ICD-10-CM | POA: Diagnosis present

## 2018-03-14 DIAGNOSIS — Z794 Long term (current) use of insulin: Secondary | ICD-10-CM | POA: Insufficient documentation

## 2018-03-14 LAB — CBC
HCT: 44.1 % (ref 36.0–46.0)
Hemoglobin: 14.3 g/dL (ref 12.0–15.0)
MCH: 30.2 pg (ref 26.0–34.0)
MCHC: 32.4 g/dL (ref 30.0–36.0)
MCV: 93 fL (ref 80.0–100.0)
Platelets: 218 10*3/uL (ref 150–400)
RBC: 4.74 MIL/uL (ref 3.87–5.11)
RDW: 13.5 % (ref 11.5–15.5)
WBC: 12.6 10*3/uL — ABNORMAL HIGH (ref 4.0–10.5)
nRBC: 0 % (ref 0.0–0.2)

## 2018-03-14 LAB — URINALYSIS, ROUTINE W REFLEX MICROSCOPIC
Bacteria, UA: NONE SEEN
Bilirubin Urine: NEGATIVE
Glucose, UA: >=500 mg/dL — AB
HGB URINE DIPSTICK: NEGATIVE
Ketones, ur: NEGATIVE mg/dL
Leukocytes, UA: NEGATIVE
Nitrite: NEGATIVE
PH: 6 (ref 5.0–8.0)
Protein, ur: NEGATIVE mg/dL
SPECIFIC GRAVITY, URINE: 1.03 (ref 1.005–1.030)

## 2018-03-14 LAB — COMPREHENSIVE METABOLIC PANEL
ALK PHOS: 200 U/L — AB (ref 38–126)
ALT: 36 U/L (ref 0–44)
ANION GAP: 11 (ref 5–15)
AST: 42 U/L — ABNORMAL HIGH (ref 15–41)
Albumin: 3.5 g/dL (ref 3.5–5.0)
BILIRUBIN TOTAL: 0.3 mg/dL (ref 0.3–1.2)
BUN: 8 mg/dL (ref 6–20)
CALCIUM: 9.1 mg/dL (ref 8.9–10.3)
CO2: 25 mmol/L (ref 22–32)
Chloride: 98 mmol/L (ref 98–111)
Creatinine, Ser: 0.65 mg/dL (ref 0.44–1.00)
GFR calc non Af Amer: >60 mL/min (ref >60)
GLUCOSE: 339 mg/dL — AB (ref 70–99)
Potassium: 3.7 mmol/L (ref 3.5–5.1)
Sodium: 134 mmol/L — ABNORMAL LOW (ref 135–145)
TOTAL PROTEIN: 7 g/dL (ref 6.5–8.1)

## 2018-03-14 LAB — I-STAT BETA HCG BLOOD, ED (MC, WL, AP ONLY)

## 2018-03-14 LAB — LIPASE, BLOOD: Lipase: 141 U/L — ABNORMAL HIGH (ref 11–51)

## 2018-03-14 MED ORDER — ONDANSETRON 4 MG PO TBDP
4.0000 mg | ORAL_TABLET | Freq: Once | ORAL | Status: AC | PRN
  Administered 2018-03-14: 4 mg via ORAL
  Filled 2018-03-14: qty 1

## 2018-03-14 MED ORDER — HYDROMORPHONE HCL 1 MG/ML IJ SOLN
1.0000 mg | Freq: Once | INTRAMUSCULAR | Status: AC
  Administered 2018-03-14: 1 mg via INTRAVENOUS
  Filled 2018-03-14: qty 1

## 2018-03-14 MED ORDER — SODIUM CHLORIDE 0.9 % IV BOLUS
1000.0000 mL | Freq: Once | INTRAVENOUS | Status: AC
  Administered 2018-03-14: 1000 mL via INTRAVENOUS

## 2018-03-14 MED ORDER — DIPHENHYDRAMINE HCL 50 MG/ML IJ SOLN
25.0000 mg | Freq: Once | INTRAMUSCULAR | Status: AC
  Administered 2018-03-14: 25 mg via INTRAVENOUS
  Filled 2018-03-14 (×2): qty 1

## 2018-03-14 NOTE — ED Notes (Signed)
Pt made aware urine sample needed. Pt advised she is unable to use the restroom at this time.

## 2018-03-14 NOTE — ED Provider Notes (Signed)
[facility] DEPT Provider Note  CSN: [id number] Arrival date & time: 03/14/18  1744    History   Chief Complaint Chief Complaint  Patient presents with  . Abdominal Pain    HPI Heather Hunt is a 37 y.o. female with a medical history of Type 2 DM and chronic pancreatitis who presented to the ED for N/V and abominal pain.   Abdominal Pain   This is a chronic problem. Episode onset: 4 days ago. The problem occurs constantly. The problem has not changed since onset.The pain is associated with an unknown factor. The pain is located in the epigastric region. The quality of the pain is sharp and aching. The pain is severe. Associated symptoms include nausea and vomiting. Pertinent negatives include anorexia, fever, diarrhea, hematochezia, melena, constipation, dysuria, frequency and hematuria. The symptoms are aggravated by certain positions. Nothing relieves the symptoms. Past medical history comments: chronic pancreatitis.    Past Medical History:  Diagnosis Date  . Diabetes mellitus without complication ([place])   . Pancreatitis   . Pancreatitis     Patient Active Problem List   Diagnosis Date Noted  . Transaminitis 01/25/2018  . Pancreatitis 01/25/2018  . Dehydration 01/25/2018  . Hyperglycemia 01/25/2018  . Acute on chronic pancreatitis ([place]) 01/15/2018  . IDDM (insulin dependent diabetes mellitus) ([place]) 01/15/2018    Past Surgical History:  Procedure Laterality Date  . BILE DUCT STENT PLACEMENT    . BILIARY STENT PLACEMENT N/A 01/27/2018   Procedure: BILIARY STENT PLACEMENT;  Surgeon: Clarene Essex, MD;  [facility];  Service: Endoscopy;  Laterality: N/A;  . CHOLECYSTECTOMY    . ERCP N/A 01/27/2018   Procedure: ENDOSCOPIC RETROGRADE CHOLANGIOPANCREATOGRAPHY (ERCP);  Surgeon: Clarene Essex, MD;  Location: [facility];  Service: Endoscopy;  Laterality: N/A;  . KNEE SURGERY    . pancreas stent    . STENT REMOVAL  01/27/2018   Procedure:  STENT REMOVAL;  Surgeon: Clarene Essex, MD;  [facility]: [facility];  Service: Endoscopy;;  . SYMPATHECTOMY       OB History   None      Home Medications    Prior to Admission medications   Medication Sig Start Date End Date Taking? Authorizing Provider  escitalopram (LEXAPRO) 10 MG tablet Take 10 mg by mouth daily.    Yes [provider]  feeding supplement, ENSURE ENLIVE, (ENSURE ENLIVE) LIQD Take 237 mLs by mouth 2 (two) times daily between meals. 01/29/18  Yes Mariel Aloe, MD  HYDROcodone-acetaminophen (NORCO/VICODIN) 5-325 MG tablet Take 1 tablet by mouth every 6 (six) hours as needed for moderate pain. Patient taking differently: Take 1 tablet by mouth 2 (two) times daily as needed for moderate pain.  01/13/18  Yes Milton Ferguson, MD  insulin degludec (TRESIBA FLEXTOUCH) 100 UNIT/ML SOPN FlexTouch Pen Inject 0.26 mLs (26 Units total) into the skin daily. 01/17/18  Yes Hall, Carole N, DO  insulin regular (HUMULIN R) 100 units/mL injection Inject 0.01-0.1 mLs (1-10 Units total) into the skin 3 (three) times daily before meals. Sliding scale 01/17/18  Yes Kayleen Memos, DO  lipase/protease/amylase (CREON) 12000 units CPEP capsule Take 2 capsules (24,000 Units total) by mouth 3 (three) times daily. 01/17/18  Yes Hall, Carole N, DO  ondansetron (ZOFRAN ODT) 4 MG disintegrating tablet Take 1 tablet (4 mg total) by mouth every 8 (eight) hours as needed for nausea. 02/08/18  Yes Larene Pickett, PA-C  pantoprazole (PROTONIX) 40 MG tablet Take 40 mg by mouth daily. 10/12/17  Yes [provider]  potassium chloride (K-DUR) 10 MEQ tablet Take 1 tablet (10 mEq total) by mouth daily. 01/13/18  Yes Milton Ferguson, MD  pregabalin (LYRICA) 50 MG capsule Take 50 mg by mouth 3 (three) times daily.   Yes [provider]  promethazine (PHENERGAN) 12.5 MG tablet Take 12.5 mg by mouth every 6 (six) hours as needed for nausea or vomiting.  11/11/17  Yes [provider]  blood  glucose meter kit and supplies KIT Dispense based on patient and insurance preference. Use up to four times daily as directed. (FOR ICD-9 250.00, 250.01). 12/07/17   Waynetta Pean, PA-C  fentaNYL (DURAGESIC - DOSED MCG/HR) 12 MCG/HR Place 25 mcg onto the skin every 3 (three) days.     [provider]  nicotine (NICODERM CQ - DOSED IN MG/24 HOURS) 14 mg/24hr patch Place 1 patch (14 mg total) onto the skin daily. Patient not taking: Reported on 01/25/2018 01/18/18   Kayleen Memos, DO    Family History Family History  Problem Relation Age of Onset  . Cancer Father   . Lung cancer Father   . Diabetes Neg Hx   . Stroke Neg Hx   . CAD Neg Hx     Social History Social History   Tobacco Use  . Smoking status: Current Every Day Smoker    Packs/day: 0.50    Types: Cigarettes  . Smokeless tobacco: Never Used  Substance Use Topics  . Alcohol use: Not Currently  . Drug use: Never     Allergies   Morphine and related   Review of Systems Review of Systems  Constitutional: Negative for fever.  Gastrointestinal: Positive for abdominal pain, nausea and vomiting. Negative for anorexia, constipation, diarrhea, hematochezia and melena.  Genitourinary: Negative for dysuria, frequency and hematuria.  All other systems reviewed and are negative.    Physical Exam Updated Vital Signs BP 97/66   Pulse 81   Temp 98.5 F (36.9 C) (Oral)   Resp 16   Ht '5\' 5"'$  (1.651 m)   Wt 59 kg   SpO2 97%   BMI 21.63 kg/m   Physical Exam  Constitutional: Vital signs are normal. She is cooperative.  Appears uncomfortable. Knees brought up to chest.  Cardiovascular: Normal rate, regular rhythm, normal heart sounds and intact distal pulses.  Pulmonary/Chest: Effort normal and breath sounds normal.  Abdominal: Soft. Normal appearance and bowel sounds are normal. There is tenderness in the epigastric area. There is guarding. There is no rigidity.  More pain when lying supine for abdominal exam.    Neurological: She is alert.  Skin: Capillary refill takes less than 2 seconds. She is not diaphoretic. No pallor.  Nursing note and vitals reviewed.    ED Treatments / Results  Labs (all labs ordered are listed, but only abnormal results are displayed) Labs Reviewed  LIPASE, BLOOD - Abnormal; Notable for the following components:      Result Value   Lipase 141 (*)    All other components within normal limits  COMPREHENSIVE METABOLIC PANEL - Abnormal; Notable for the following components:   Sodium 134 (*)    Glucose, Bld 339 (*)    AST 42 (*)    Alkaline Phosphatase 200 (*)    All other components within normal limits  CBC - Abnormal; Notable for the following components:   WBC 12.6 (*)    All other components within normal limits  URINALYSIS, ROUTINE W REFLEX MICROSCOPIC - Abnormal; Notable for the following  components:   Color, Urine STRAW (*)    Glucose, UA >=500 (*)    All other components within normal limits  I-STAT BETA HCG BLOOD, ED ([facility], [facility], [facility] ONLY)    EKG None  Radiology No results found.  Procedures Procedures (including critical care time)  Medications Ordered in ED Medications  ondansetron (ZOFRAN-ODT) disintegrating tablet 4 mg (4 mg Oral Given 03/14/18 1816)  sodium chloride 0.9 % bolus 1,000 mL (0 mLs Intravenous Stopped 03/14/18 2208)  HYDROmorphone (DILAUDID) injection 1 mg (1 mg Intravenous Given 03/14/18 2107)  diphenhydrAMINE (BENADRYL) injection 25 mg (25 mg Intravenous Given 03/14/18 2111)  HYDROmorphone (DILAUDID) injection 1 mg (1 mg Intravenous Given 03/14/18 2251)     Initial Impression / Assessment and Plan / ED Course  Triage vital signs and the nursing notes have been reviewed.  Pertinent labs & imaging results that were available during care of the patient were reviewed and considered in medical decision making (see chart for details).  Patient with a history of pancreatitis presents afebrile and in no acute distress with recent  onset abdominal pain and N/V. She states it feels like a pancreatitis flare. Patient is on multiple agents for pain. Patient states her flares are usually precipitated by spicy or fatty foods which she states she has not eaten. She reports compliance with GI medications and denies recent illness. Physical exam consistent with pancreatitis. Will evaluate abdominal labs and compare with baseline. Clinical Course as of Mar 14 2338  Mon Mar 14, 2018  2046 Elevated lipase which is expected with patient's history of pancreatitis. Decreased lipase from last lipase drawn 1 month ago. Mildly elevated WBC and AST consistent with possible acute pancreatitis flare. Elevated glucose, but no anion gap or other s/s that suggest DKA. Remaining labs unremarkable. No findings to suggest biliary obstruction.   [GM]  2234 Reassessed patient after IV pain medication, antiemetic and fluids. Better control of symptoms. HR has decreased appropriately. Patient able to tolerate PO.   [GM]  2337 UA not suggestive of UTI. No ketones in urine.   [GM]    Clinical Course User Index [GM] Mortis, Jonelle Sports, PA-C     Final Clinical Impressions(s) / ED Diagnoses  1. Chronic Pancreatitis. Pain controlled achieved in the ED. Advised to follow-up with GI provider for follow-up. Education provided on red flag s/s that warrant return to the ED vs GI.  Dispo: Home. After thorough clinical evaluation, this patient is determined to be medically stable and can be safely discharged with the previously mentioned treatment and/or outpatient follow-up/referral(s). At this time, there are no other apparent medical conditions that require further screening, evaluation or treatment.   Final diagnoses:  Alcohol-induced chronic pancreatitis [facility])    ED Discharge Orders    None        Junita Push 03/14/18 2339    Dorie Rank, MD 03/15/18 [phone]

## 2018-03-14 NOTE — ED Triage Notes (Signed)
Patient reports LUQ pain with N/V x "a few days." Denies diarrhea. Hx pancreatitis.

## 2018-03-14 NOTE — Discharge Instructions (Addendum)
Your labs today look improved from the last time they were drawn. There are no signs of any duct obstruction.  Continue to take your medications as prescribed by your GI doctor. Try to stay hydrate and eat small, frequent meals. Call your GI doctor tomorrow and schedule a follow-up soon.  Thank you for allowing us to take care of you today.

## 2018-03-15 NOTE — ED Notes (Signed)
Pt stated that she has a roommate that is coming to pick her up.

## 2018-04-18 ENCOUNTER — Other Ambulatory Visit: Payer: Self-pay

## 2018-04-18 ENCOUNTER — Encounter (HOSPITAL_COMMUNITY): Payer: Self-pay | Admitting: Emergency Medicine

## 2018-04-18 DIAGNOSIS — F17210 Nicotine dependence, cigarettes, uncomplicated: Secondary | ICD-10-CM | POA: Diagnosis not present

## 2018-04-18 DIAGNOSIS — K859 Acute pancreatitis without necrosis or infection, unspecified: Secondary | ICD-10-CM | POA: Diagnosis not present

## 2018-04-18 DIAGNOSIS — E1165 Type 2 diabetes mellitus with hyperglycemia: Secondary | ICD-10-CM | POA: Insufficient documentation

## 2018-04-18 DIAGNOSIS — R1084 Generalized abdominal pain: Secondary | ICD-10-CM | POA: Diagnosis present

## 2018-04-18 DIAGNOSIS — Z79899 Other long term (current) drug therapy: Secondary | ICD-10-CM | POA: Insufficient documentation

## 2018-04-18 DIAGNOSIS — Z794 Long term (current) use of insulin: Secondary | ICD-10-CM | POA: Diagnosis not present

## 2018-04-18 MED ORDER — ONDANSETRON 4 MG PO TBDP
4.0000 mg | ORAL_TABLET | Freq: Once | ORAL | Status: AC | PRN
  Administered 2018-04-18: 4 mg via ORAL
  Filled 2018-04-18: qty 1

## 2018-04-18 NOTE — ED Triage Notes (Signed)
Pt complain of abdominal pain and N/V/D started yesterday.Pt reported 3-4 watery diarrhea and vomited 4-5 today.Patient took oxycodone 10mg  and zofran at 6 pm. Denies fever.

## 2018-04-19 ENCOUNTER — Emergency Department (HOSPITAL_COMMUNITY)
Admission: EM | Admit: 2018-04-19 | Discharge: 2018-04-19 | Disposition: A | Discharge status: Home / Self Care | Payer: BLUE CROSS/BLUE SHIELD | Attending: Emergency Medicine | Admitting: Emergency Medicine

## 2018-04-19 DIAGNOSIS — K861 Other chronic pancreatitis: Secondary | ICD-10-CM

## 2018-04-19 DIAGNOSIS — R739 Hyperglycemia, unspecified: Secondary | ICD-10-CM

## 2018-04-19 DIAGNOSIS — K859 Acute pancreatitis without necrosis or infection, unspecified: Secondary | ICD-10-CM

## 2018-04-19 LAB — COMPREHENSIVE METABOLIC PANEL
ALT: 24 U/L (ref 0–44)
AST: 37 U/L (ref 15–41)
Albumin: 4.1 g/dL (ref 3.5–5.0)
Alkaline Phosphatase: 109 U/L (ref 38–126)
Anion gap: 10 (ref 5–15)
BUN: 8 mg/dL (ref 6–20)
CO2: 26 mmol/L (ref 22–32)
Calcium: 9.1 mg/dL (ref 8.9–10.3)
Chloride: 96 mmol/L — ABNORMAL LOW (ref 98–111)
Creatinine, Ser: 0.65 mg/dL (ref 0.44–1.00)
GFR calc Af Amer: >60 mL/min (ref >60)
GFR calc non Af Amer: >60 mL/min (ref >60)
Glucose, Bld: 516 mg/dL (ref 70–99)
Potassium: 4.7 mmol/L (ref 3.5–5.1)
Sodium: 132 mmol/L — ABNORMAL LOW (ref 135–145)
Total Bilirubin: 1 mg/dL (ref 0.3–1.2)
Total Protein: 7.8 g/dL (ref 6.5–8.1)

## 2018-04-19 LAB — URINALYSIS, ROUTINE W REFLEX MICROSCOPIC
Bacteria, UA: NONE SEEN
Bilirubin Urine: NEGATIVE
Hgb urine dipstick: NEGATIVE
Ketones, ur: NEGATIVE mg/dL
Leukocytes, UA: NEGATIVE
NITRITE: NEGATIVE
Protein, ur: NEGATIVE mg/dL
Specific Gravity, Urine: 1.036 — ABNORMAL HIGH (ref 1.005–1.030)
pH: 7 (ref 5.0–8.0)

## 2018-04-19 LAB — CBC
HCT: 43.7 % (ref 36.0–46.0)
Hemoglobin: 14.5 g/dL (ref 12.0–15.0)
MCH: 30.7 pg (ref 26.0–34.0)
MCHC: 33.2 g/dL (ref 30.0–36.0)
MCV: 92.4 fL (ref 80.0–100.0)
Platelets: 180 10*3/uL (ref 150–400)
RBC: 4.73 MIL/uL (ref 3.87–5.11)
RDW: 13.2 % (ref 11.5–15.5)
WBC: 8.1 10*3/uL (ref 4.0–10.5)
nRBC: 0 % (ref 0.0–0.2)

## 2018-04-19 LAB — CBG MONITORING, ED: Glucose-Capillary: 450 mg/dL — ABNORMAL HIGH (ref 70–99)

## 2018-04-19 LAB — I-STAT BETA HCG BLOOD, ED (MC, WL, AP ONLY): I-stat hCG, quantitative: <5 m[IU]/mL (ref <5)

## 2018-04-19 LAB — LIPASE, BLOOD: LIPASE: 110 U/L — AB (ref 11–51)

## 2018-04-19 MED ORDER — METOCLOPRAMIDE HCL 5 MG/ML IJ SOLN
10.0000 mg | Freq: Once | INTRAMUSCULAR | Status: AC
  Administered 2018-04-19: 10 mg via INTRAVENOUS
  Filled 2018-04-19: qty 2

## 2018-04-19 MED ORDER — INSULIN ASPART 100 UNIT/ML ~~LOC~~ SOLN
10.0000 [IU] | Freq: Once | SUBCUTANEOUS | Status: AC
  Administered 2018-04-19: 10 [IU] via SUBCUTANEOUS
  Filled 2018-04-19: qty 1

## 2018-04-19 MED ORDER — HYDROMORPHONE HCL 1 MG/ML IJ SOLN
1.0000 mg | Freq: Once | INTRAMUSCULAR | Status: AC
  Administered 2018-04-19: 1 mg via INTRAVENOUS
  Filled 2018-04-19: qty 1

## 2018-04-19 MED ORDER — INSULIN ASPART 100 UNIT/ML IV SOLN
10.0000 [IU] | Freq: Once | INTRAVENOUS | Status: AC
  Administered 2018-04-19: 10 [IU] via INTRAVENOUS
  Filled 2018-04-19: qty 0.1

## 2018-04-19 MED ORDER — DIPHENHYDRAMINE HCL 50 MG/ML IJ SOLN
25.0000 mg | Freq: Once | INTRAMUSCULAR | Status: AC
  Administered 2018-04-19: 25 mg via INTRAVENOUS
  Filled 2018-04-19: qty 1

## 2018-04-19 MED ORDER — SODIUM CHLORIDE 0.9 % IV BOLUS
1000.0000 mL | Freq: Once | INTRAVENOUS | Status: AC
  Administered 2018-04-19: 1000 mL via INTRAVENOUS

## 2018-04-19 NOTE — ED Provider Notes (Signed)
[facility] DEPT Provider Note   CSN: [id number] Arrival date & time: 04/18/18  2123     History   Chief Complaint Chief Complaint  Patient presents with  . Abdominal Pain  . N/V/D    HPI Heather Hunt is a 37 y.o. female.  The history is provided by the patient.  Abdominal Pain    She has history of diabetes and chronic pancreatitis and is managed by a pain management clinic in [place] and comes in with worsening of abdominal pain typical of her pancreatitis.  Pain got worse 2 days ago and is across the upper abdomen with radiation of the back.  There is associated nausea and vomiting.  Pain is not affected by vomiting.  She denies any constipation or diarrhea.  Pain is rated at 9/10.  At home, she has a fentanyl patch which is not helping.  Past Medical History:  Diagnosis Date  . Diabetes mellitus without complication ([place])   . Pancreatitis   . Pancreatitis     Patient Active Problem List   Diagnosis Date Noted  . Transaminitis 01/25/2018  . Pancreatitis 01/25/2018  . Dehydration 01/25/2018  . Hyperglycemia 01/25/2018  . Acute on chronic pancreatitis ([place]) 01/15/2018  . IDDM (insulin dependent diabetes mellitus) ([place]) 01/15/2018    Past Surgical History:  Procedure Laterality Date  . BILE DUCT STENT PLACEMENT    . BILIARY STENT PLACEMENT N/A 01/27/2018   Procedure: BILIARY STENT PLACEMENT;  Surgeon: Clarene Essex, MD;  Location: [facility];  Service: Endoscopy;  Laterality: N/A;  . CHOLECYSTECTOMY    . ERCP N/A 01/27/2018   Procedure: ENDOSCOPIC RETROGRADE CHOLANGIOPANCREATOGRAPHY (ERCP);  Surgeon: Clarene Essex, MD;  Location: [facility];  Service: Endoscopy;  Laterality: N/A;  . KNEE SURGERY    . pancreas stent    . STENT REMOVAL  01/27/2018   Procedure: STENT REMOVAL;  Surgeon: Clarene Essex, MD;  Location: [facility];  Service: Endoscopy;;  . SYMPATHECTOMY       OB History   No obstetric history on file.      Home  Medications    Prior to Admission medications   Medication Sig Start Date End Date Taking? Authorizing Provider  escitalopram (LEXAPRO) 10 MG tablet Take 10 mg by mouth daily.    Yes [provider]  feeding supplement, ENSURE ENLIVE, (ENSURE ENLIVE) LIQD Take 237 mLs by mouth 2 (two) times daily between meals. Patient taking differently: Take 237 mLs by mouth daily as needed (nutrition supplement).  01/29/18  Yes Mariel Aloe, MD  fentaNYL (DURAGESIC - DOSED MCG/HR) 12 MCG/HR Place 25 mcg onto the skin every 3 (three) days.    Yes [provider]  insulin degludec (TRESIBA FLEXTOUCH) 100 UNIT/ML SOPN FlexTouch Pen Inject 0.26 mLs (26 Units total) into the skin daily. 01/17/18  Yes Hall, Carole N, DO  insulin regular (HUMULIN R) 100 units/mL injection Inject 0.01-0.1 mLs (1-10 Units total) into the skin 3 (three) times daily before meals. Sliding scale Patient taking differently: Inject 1-10 Units into the skin 3 (three) times daily before meals. Per sliding scale 01/17/18  Yes Kayleen Memos, DO  lipase/protease/amylase (CREON) 12000 units CPEP capsule Take 2 capsules (24,000 Units total) by mouth 3 (three) times daily. 01/17/18  Yes Hall, Carole N, DO  ondansetron (ZOFRAN ODT) 4 MG disintegrating tablet Take 1 tablet (4 mg total) by mouth every 8 (eight) hours as needed for nausea. 02/08/18  Yes Larene Pickett, PA-C  oxyCODONE (OXYCONTIN) 10 mg  12 hr tablet Take 10 mg by mouth 2 (two) times daily.   Yes [provider]  pantoprazole (PROTONIX) 40 MG tablet Take 40 mg by mouth daily. 10/12/17  Yes [provider]  potassium chloride (K-DUR) 10 MEQ tablet Take 1 tablet (10 mEq total) by mouth daily. 01/13/18  Yes Milton Ferguson, MD  pregabalin (LYRICA) 50 MG capsule Take 50 mg by mouth 3 (three) times daily.   Yes [provider]  promethazine (PHENERGAN) 12.5 MG tablet Take 12.5 mg by mouth every 6 (six) hours as needed for nausea or vomiting.  11/11/17   Yes [provider]  blood glucose meter kit and supplies KIT Dispense based on patient and insurance preference. Use up to four times daily as directed. (FOR ICD-9 250.00, 250.01). 12/07/17   Waynetta Pean, PA-C  HYDROcodone-acetaminophen (NORCO/VICODIN) 5-325 MG tablet Take 1 tablet by mouth every 6 (six) hours as needed for moderate pain. Patient not taking: Reported on 04/19/2018 01/13/18   Milton Ferguson, MD  nicotine (NICODERM CQ - DOSED IN MG/24 HOURS) 14 mg/24hr patch Place 1 patch (14 mg total) onto the skin daily. Patient not taking: Reported on 01/25/2018 01/18/18   Kayleen Memos, DO    Family History Family History  Problem Relation Age of Onset  . Cancer Father   . Lung cancer Father   . Diabetes Neg Hx   . Stroke Neg Hx   . CAD Neg Hx     Social History Social History   Tobacco Use  . Smoking status: Current Every Day Smoker    Packs/day: 0.50    Types: Cigarettes  . Smokeless tobacco: Never Used  Substance Use Topics  . Alcohol use: Not Currently  . Drug use: Never     Allergies   Morphine and related   Review of Systems Review of Systems  Gastrointestinal: Positive for abdominal pain.  All other systems reviewed and are negative.    Physical Exam Updated Vital Signs BP 95/74 (BP Location: Right Arm)   Pulse 76   Temp 97.8 F (36.6 C) (Oral)   Resp 16   Ht _0  (1.676 m)   Wt 59 kg   SpO2 99%   BMI 20.98 kg/m   Physical Exam Vitals signs and nursing note reviewed.    37 year old female, appears uncomfortable and in pain, but is in no acute distress. Vital signs are normal. Oxygen saturation is 99%, which is normal. Head is normocephalic and atraumatic. PERRLA, EOMI. Oropharynx is clear. Neck is nontender and supple without adenopathy or JVD. Back is nontender and there is no CVA tenderness. Lungs are clear without rales, wheezes, or rhonchi. Chest is nontender. Heart has regular rate and rhythm without murmur. Abdomen is soft,  flat, with moderate upper abdominal tenderness.  There is no rebound or guarding.  There are no masses or hepatosplenomegaly and peristalsis is hypoactive. Extremities have no cyanosis or edema, full range of motion is present. Skin is warm and dry without rash. Neurologic: Mental status is normal, cranial nerves are intact, there are no motor or sensory deficits.  ED Treatments / Results  Labs (all labs ordered are listed, but only abnormal results are displayed) Labs Reviewed  URINALYSIS, ROUTINE W REFLEX MICROSCOPIC - Abnormal; Notable for the following components:      Result Value   APPearance HAZY (*)    Specific Gravity, Urine 1.036 (*)    Glucose, UA >=500 (*)    All other components within normal  limits  CBC  LIPASE, BLOOD  COMPREHENSIVE METABOLIC PANEL  I-STAT BETA HCG BLOOD, ED ([facility], [facility], [facility] ONLY)   Procedures Procedures   Medications Ordered in ED Medications  insulin aspart (novoLOG) injection 10 Units (has no administration in time range)  ondansetron (ZOFRAN-ODT) disintegrating tablet 4 mg (4 mg Oral Given 04/18/18 2333)  metoCLOPramide (REGLAN) injection 10 mg (10 mg Intravenous Given 04/19/18 0408)  diphenhydrAMINE (BENADRYL) injection 25 mg (25 mg Intravenous Given 04/19/18 0408)  HYDROmorphone (DILAUDID) injection 1 mg (1 mg Intravenous Given 04/19/18 0409)  sodium chloride 0.9 % bolus 1,000 mL (0 mLs Intravenous Stopped 04/19/18 0523)  insulin aspart (novoLOG) injection 10 Units (10 Units Intravenous Given 04/19/18 0440)  HYDROmorphone (DILAUDID) injection 1 mg (1 mg Intravenous Given 04/19/18 0522)     Initial Impression / Assessment and Plan / ED Course  I have reviewed the triage vital signs and the nursing notes.  Pertinent labs & imaging results that were available during my care of the patient were reviewed by me and considered in my medical decision making (see chart for details).  Abdominal pain consistent with pancreatitis.  Old records are  reviewed, and she has multiple ED visits as well as hospitalizations for similar pain.  She has had 3 CT scans of her abdomen and pelvis since September.  I also suspect that she has had multiple CT scans outside of our system.  She will be given IV fluids, hydromorphone, metoclopramide.  She had received ondansetron oral dissolving tablet prior to my seeing her, with no relief of nausea.  Following above-noted treatment, patient stated she was feeling somewhat better but still having significant abdominal pain.  She is given a second dose of hydromorphone with further improvement in pain.  She felt that she was doing well enough to go home.  Labs do show significantly elevated glucose which has come down with insulin, also moderately elevated lipase.  She is discharged with instructions to continue her home pain medication regimen, follow-up with her gastroenterologist.  Return if symptoms are not being adequately controlled at home.  Final Clinical Impressions(s) / ED Diagnoses   Final diagnoses:  Acute on chronic pancreatitis [facility])  Hyperglycemia    ED Discharge Orders    None       Delora Fuel, MD 87/56/43 [phone]

## 2018-04-19 NOTE — ED Notes (Signed)
I called patient name in the lobby and went outside looking for her and no one responded

## 2018-04-19 NOTE — Discharge Instructions (Signed)
Take your pain medication as prescribed.   Return to the Emergency Department if symptoms are getting worse.

## 2018-05-02 ENCOUNTER — Encounter (HOSPITAL_COMMUNITY): Payer: Self-pay | Admitting: Emergency Medicine

## 2018-05-02 DIAGNOSIS — R1013 Epigastric pain: Secondary | ICD-10-CM | POA: Diagnosis not present

## 2018-05-02 DIAGNOSIS — R112 Nausea with vomiting, unspecified: Secondary | ICD-10-CM | POA: Diagnosis not present

## 2018-05-02 DIAGNOSIS — Z79899 Other long term (current) drug therapy: Secondary | ICD-10-CM | POA: Insufficient documentation

## 2018-05-02 DIAGNOSIS — E1165 Type 2 diabetes mellitus with hyperglycemia: Secondary | ICD-10-CM | POA: Diagnosis not present

## 2018-05-02 DIAGNOSIS — F17210 Nicotine dependence, cigarettes, uncomplicated: Secondary | ICD-10-CM | POA: Insufficient documentation

## 2018-05-02 LAB — CBC
HCT: 41.1 % (ref 36.0–46.0)
Hemoglobin: 13.5 g/dL (ref 12.0–15.0)
MCH: 30.1 pg (ref 26.0–34.0)
MCHC: 32.8 g/dL (ref 30.0–36.0)
MCV: 91.7 fL (ref 80.0–100.0)
NRBC: 0 % (ref 0.0–0.2)
Platelets: 140 10*3/uL — ABNORMAL LOW (ref 150–400)
RBC: 4.48 MIL/uL (ref 3.87–5.11)
RDW: 12.8 % (ref 11.5–15.5)
WBC: 10.4 10*3/uL (ref 4.0–10.5)

## 2018-05-02 LAB — URINALYSIS, ROUTINE W REFLEX MICROSCOPIC
BILIRUBIN URINE: NEGATIVE
Bacteria, UA: NONE SEEN
Glucose, UA: >=500 mg/dL — AB
Hgb urine dipstick: NEGATIVE
Ketones, ur: 80 mg/dL — AB
Leukocytes, UA: NEGATIVE
Nitrite: NEGATIVE
Protein, ur: NEGATIVE mg/dL
Specific Gravity, Urine: 1.04 — ABNORMAL HIGH (ref 1.005–1.030)
pH: 5 (ref 5.0–8.0)

## 2018-05-02 LAB — COMPREHENSIVE METABOLIC PANEL
ALT: 20 U/L (ref 0–44)
AST: 17 U/L (ref 15–41)
Albumin: 4.5 g/dL (ref 3.5–5.0)
Alkaline Phosphatase: 110 U/L (ref 38–126)
Anion gap: 15 (ref 5–15)
BUN: 12 mg/dL (ref 6–20)
CO2: 24 mmol/L (ref 22–32)
Calcium: 9.2 mg/dL (ref 8.9–10.3)
Chloride: 96 mmol/L — ABNORMAL LOW (ref 98–111)
Creatinine, Ser: 0.65 mg/dL (ref 0.44–1.00)
GFR calc non Af Amer: >60 mL/min (ref >60)
Glucose, Bld: 301 mg/dL — ABNORMAL HIGH (ref 70–99)
Potassium: 3.4 mmol/L — ABNORMAL LOW (ref 3.5–5.1)
Sodium: 135 mmol/L (ref 135–145)
Total Bilirubin: 1 mg/dL (ref 0.3–1.2)
Total Protein: 7.5 g/dL (ref 6.5–8.1)

## 2018-05-02 LAB — LIPASE, BLOOD: LIPASE: 103 U/L — AB (ref 11–51)

## 2018-05-02 LAB — I-STAT BETA HCG BLOOD, ED (MC, WL, AP ONLY): I-stat hCG, quantitative: <5 m[IU]/mL (ref <5)

## 2018-05-02 NOTE — ED Triage Notes (Signed)
Pt c/o upper abd pains with n/v since Friday. reports hx pancreatitis and has biliary stents 2-3 months ago. Denies diarhea since Last Friday.

## 2018-05-03 ENCOUNTER — Emergency Department (HOSPITAL_COMMUNITY)
Admission: EM | Admit: 2018-05-03 | Discharge: 2018-05-03 | Disposition: A | Discharge status: Home / Self Care | Payer: BLUE CROSS/BLUE SHIELD | Attending: Emergency Medicine | Admitting: Emergency Medicine

## 2018-05-03 DIAGNOSIS — R112 Nausea with vomiting, unspecified: Secondary | ICD-10-CM

## 2018-05-03 DIAGNOSIS — R1013 Epigastric pain: Secondary | ICD-10-CM

## 2018-05-03 DIAGNOSIS — R739 Hyperglycemia, unspecified: Secondary | ICD-10-CM

## 2018-05-03 MED ORDER — ONDANSETRON HCL 4 MG/2ML IJ SOLN
4.0000 mg | Freq: Once | INTRAMUSCULAR | Status: AC
  Administered 2018-05-03: 4 mg via INTRAVENOUS
  Filled 2018-05-03: qty 2

## 2018-05-03 MED ORDER — KETOROLAC TROMETHAMINE 30 MG/ML IJ SOLN
30.0000 mg | Freq: Once | INTRAMUSCULAR | Status: AC
  Administered 2018-05-03: 30 mg via INTRAVENOUS
  Filled 2018-05-03: qty 1

## 2018-05-03 MED ORDER — DIPHENHYDRAMINE HCL 50 MG/ML IJ SOLN
25.0000 mg | Freq: Once | INTRAMUSCULAR | Status: AC
  Administered 2018-05-03: 25 mg via INTRAVENOUS
  Filled 2018-05-03: qty 1

## 2018-05-03 MED ORDER — HYDROMORPHONE HCL 1 MG/ML IJ SOLN
1.0000 mg | Freq: Once | INTRAMUSCULAR | Status: AC
  Administered 2018-05-03: 1 mg via INTRAVENOUS
  Filled 2018-05-03: qty 1

## 2018-05-03 MED ORDER — SODIUM CHLORIDE 0.9 % IV BOLUS (SEPSIS)
1000.0000 mL | Freq: Once | INTRAVENOUS | Status: AC
  Administered 2018-05-03: 1000 mL via INTRAVENOUS

## 2018-05-03 MED ORDER — METOCLOPRAMIDE HCL 5 MG/ML IJ SOLN
10.0000 mg | Freq: Once | INTRAMUSCULAR | Status: AC
  Administered 2018-05-03: 10 mg via INTRAVENOUS
  Filled 2018-05-03: qty 2

## 2018-05-03 NOTE — ED Provider Notes (Signed)
[facility] DEPT Provider Note   CSN: [id number] Arrival date & time: 05/02/18  1718     History   Chief Complaint Chief Complaint  Patient presents with  . Abdominal Pain  . Emesis  . Fatigue    HPI Heather Hunt is a 38 y.o. female.  The history is provided by the patient.  Abdominal Pain  Pain location:  Epigastric Pain severity:  Severe Onset quality:  Gradual Timing:  Constant Progression:  Worsening Chronicity:  Recurrent Relieved by:  Nothing Worsened by:  Nothing Associated symptoms: nausea and vomiting   Associated symptoms: no chest pain, no diarrhea, no fever and no shortness of breath   Emesis  Associated symptoms: abdominal pain   Associated symptoms: no diarrhea and no fever   Patient history of diabetes, chronic pancreatitis, chronic pain presents for epigastric abdominal pain.  She reports he has daily abdominal pain, but over the past several days has been having increased abdominal pain.  She reports she is having nausea and nonbloody emesis.  No chest pain or shortness of breath.  No fever.  Past Medical History:  Diagnosis Date  . Diabetes mellitus without complication ([place])   . Pancreatitis   . Pancreatitis     Patient Active Problem List   Diagnosis Date Noted  . Transaminitis 01/25/2018  . Pancreatitis 01/25/2018  . Dehydration 01/25/2018  . Hyperglycemia 01/25/2018  . Acute on chronic pancreatitis ([place]) 01/15/2018  . IDDM (insulin dependent diabetes mellitus) ([place]) 01/15/2018    Past Surgical History:  Procedure Laterality Date  . BILE DUCT STENT PLACEMENT    . BILIARY STENT PLACEMENT N/A 01/27/2018   Procedure: BILIARY STENT PLACEMENT;  Surgeon: Clarene Essex, MD;  Location: [facility];  Service: Endoscopy;  Laterality: N/A;  . CHOLECYSTECTOMY    . ERCP N/A 01/27/2018   Procedure: ENDOSCOPIC RETROGRADE CHOLANGIOPANCREATOGRAPHY (ERCP);  Surgeon: Clarene Essex, MD;  Location: [facility];  Service:  Endoscopy;  Laterality: N/A;  . KNEE SURGERY    . pancreas stent    . STENT REMOVAL  01/27/2018   Procedure: STENT REMOVAL;  Surgeon: Clarene Essex, MD;  Location: [facility];  Service: Endoscopy;;  . SYMPATHECTOMY       OB History   No obstetric history on file.      Home Medications    Prior to Admission medications   Medication Sig Start Date End Date Taking? Authorizing Provider  escitalopram (LEXAPRO) 10 MG tablet Take 10 mg by mouth daily.    Yes [provider]  feeding supplement, ENSURE ENLIVE, (ENSURE ENLIVE) LIQD Take 237 mLs by mouth 2 (two) times daily between meals. Patient taking differently: Take 237 mLs by mouth daily as needed (nutrition supplement).  01/29/18  Yes Mariel Aloe, MD  fentaNYL (DURAGESIC - DOSED MCG/HR) 12 MCG/HR Place 25 mcg onto the skin every 3 (three) days.    Yes [provider]  insulin degludec (TRESIBA FLEXTOUCH) 100 UNIT/ML SOPN FlexTouch Pen Inject 0.26 mLs (26 Units total) into the skin daily. 01/17/18  Yes Hall, Carole N, DO  insulin regular (HUMULIN R) 100 units/mL injection Inject 0.01-0.1 mLs (1-10 Units total) into the skin 3 (three) times daily before meals. Sliding scale Patient taking differently: Inject 1-10 Units into the skin 3 (three) times daily before meals. Per sliding scale 01/17/18  Yes Kayleen Memos, DO  lipase/protease/amylase (CREON) 12000 units CPEP capsule Take 2 capsules (24,000 Units total) by mouth 3 (three) times daily. 01/17/18  Yes Kayleen Memos,  DO  oxyCODONE (OXYCONTIN) 10 mg 12 hr tablet Take 10 mg by mouth 2 (two) times daily.   Yes [provider]  pantoprazole (PROTONIX) 40 MG tablet Take 40 mg by mouth daily. 10/12/17  Yes [provider]  potassium chloride (K-DUR) 10 MEQ tablet Take 1 tablet (10 mEq total) by mouth daily. 01/13/18  Yes Milton Ferguson, MD  pregabalin (LYRICA) 50 MG capsule Take 50 mg by mouth 3 (three) times daily.   Yes [provider]    promethazine (PHENERGAN) 12.5 MG tablet Take 12.5 mg by mouth every 6 (six) hours as needed for nausea or vomiting.  11/11/17  Yes [provider]  blood glucose meter kit and supplies KIT Dispense based on patient and insurance preference. Use up to four times daily as directed. (FOR ICD-9 250.00, 250.01). 12/07/17   Waynetta Pean, PA-C  ondansetron (ZOFRAN ODT) 4 MG disintegrating tablet Take 1 tablet (4 mg total) by mouth every 8 (eight) hours as needed for nausea. 02/08/18   Larene Pickett, PA-C    Family History Family History  Problem Relation Age of Onset  . Cancer Father   . Lung cancer Father   . Diabetes Neg Hx   . Stroke Neg Hx   . CAD Neg Hx     Social History Social History   Tobacco Use  . Smoking status: Current Every Day Smoker    Packs/day: 0.50    Types: Cigarettes  . Smokeless tobacco: Never Used  Substance Use Topics  . Alcohol use: Not Currently  . Drug use: Never     Allergies   Morphine and related   Review of Systems Review of Systems  Constitutional: Negative for fever.  Respiratory: Negative for shortness of breath.   Cardiovascular: Negative for chest pain.  Gastrointestinal: Positive for abdominal pain, nausea and vomiting. Negative for diarrhea.  All other systems reviewed and are negative.    Physical Exam Updated Vital Signs BP 100/71   Pulse 76   Temp 98.7 F (37.1 C) (Oral)   Resp 14   Ht 1.676 m ('5\' 6"'$ )   Wt 59 kg   SpO2 96%   BMI 20.98 kg/m   Physical Exam  CONSTITUTIONAL: Uncomfortable appearing HEAD: Normocephalic/atraumatic EYES: EOMI/PERRL ENMT: Mucous membranes moist NECK: supple no meningeal signs SPINE/BACK:entire spine nontender CV: S1/S2 noted, no murmurs/rubs/gallops noted LUNGS: Lungs are clear to auscultation bilaterally, no apparent distress ABDOMEN: soft, moderate epigastric tenderness, no rebound or guarding, bowel sounds noted throughout abdomen GU:no cva tenderness NEURO: Pt is  awake/alert/appropriate, moves all extremitiesx4.  No facial droop.   EXTREMITIES: pulses normal/equal, full ROM SKIN: warm, color normal PSYCH: no abnormalities of mood noted, alert and oriented to situation  ED Treatments / Results  Labs (all labs ordered are listed, but only abnormal results are displayed) Labs Reviewed  LIPASE, BLOOD - Abnormal; Notable for the following components:      Result Value   Lipase 103 (*)    All other components within normal limits  COMPREHENSIVE METABOLIC PANEL - Abnormal; Notable for the following components:   Potassium 3.4 (*)    Chloride 96 (*)    Glucose, Bld 301 (*)    All other components within normal limits  CBC - Abnormal; Notable for the following components:   Platelets 140 (*)    All other components within normal limits  URINALYSIS, ROUTINE W REFLEX MICROSCOPIC - Abnormal; Notable for the following components:   APPearance HAZY (*)    Specific  Gravity, Urine 1.040 (*)    Glucose, UA >=500 (*)    Ketones, ur 80 (*)    All other components within normal limits  I-STAT BETA HCG BLOOD, ED ([facility], [facility], [facility] ONLY)    EKG None  Radiology No results found.  Procedures Procedures (including critical care time)  Medications Ordered in ED Medications  HYDROmorphone (DILAUDID) injection 1 mg (has no administration in time range)  ondansetron (ZOFRAN) injection 4 mg (has no administration in time range)  metoCLOPramide (REGLAN) injection 10 mg (10 mg Intravenous Given 05/03/18 0114)  diphenhydrAMINE (BENADRYL) injection 25 mg (25 mg Intravenous Given 05/03/18 0114)  sodium chloride 0.9 % bolus 1,000 mL (0 mLs Intravenous Stopped 05/03/18 0145)  ondansetron (ZOFRAN) injection 4 mg (4 mg Intravenous Given 05/03/18 0312)  HYDROmorphone (DILAUDID) injection 1 mg (1 mg Intravenous Given 05/03/18 0312)  ketorolac (TORADOL) 30 MG/ML injection 30 mg (30 mg Intravenous Given 05/03/18 5366)     Initial Impression / Assessment and Plan / ED Course  I have  reviewed the triage vital signs and the nursing notes.  Pertinent labs  results that were available during my care of the patient were reviewed by me and considered in my medical decision making (see chart for details).     3:34 AM Patient has extensive history of pancreatitis as well as chronic pain.  Her chronic pain is managed at an outside pain management center.  She recently located to [place] from [place].  She is still establishing primary care.  She admits that most of her pain is chronic/daily, but is been worse in the past couple days. Patient was admitted back in October and required an ERCP, but since that time is had CT scans of her abdomen Other than hyperglycemia, labs are reassuring. She denies any recent alcohol abuse  I first attempted to manage symptoms of nausea and vomiting with Reglan and Benadryl.  This was unsuccessful. She has been given Dilaudid and Zofran, but I have urged her to follow-up closely as an outpatient.  She reports she has GI follow-up soon, as well as pain management follow-up and reports she may be getting a nerve block by her pain specialist  She was  noted to be dehydrated with hyperglycemia, but no anion gap  4:44 AM Patient with some improvement, but still pain.  Will give final dose of Dilaudid and discharged home. Patient appropriate discharge.  Suspicion for other acute abdominal emergency is low Final Clinical Impressions(s) / ED Diagnoses   Final diagnoses:  Epigastric pain  Hyperglycemia  Non-intractable vomiting with nausea, unspecified vomiting type    ED Discharge Orders    None       Ripley Fraise, MD 05/03/18 [phone]

## 2018-05-03 NOTE — ED Notes (Signed)
Bed: WA19 Expected date:  Expected time:  Means of arrival:  Comments: Pt getting dressed 

## 2018-06-02 ENCOUNTER — Other Ambulatory Visit: Payer: Self-pay

## 2018-06-02 ENCOUNTER — Encounter (HOSPITAL_COMMUNITY): Payer: Self-pay

## 2018-06-02 ENCOUNTER — Emergency Department (HOSPITAL_COMMUNITY)
Admission: EM | Admit: 2018-06-02 | Discharge: 2018-06-02 | Disposition: A | Discharge status: Home / Self Care | Payer: BLUE CROSS/BLUE SHIELD | Attending: Emergency Medicine | Admitting: Emergency Medicine

## 2018-06-02 DIAGNOSIS — F17210 Nicotine dependence, cigarettes, uncomplicated: Secondary | ICD-10-CM | POA: Diagnosis not present

## 2018-06-02 DIAGNOSIS — G8929 Other chronic pain: Secondary | ICD-10-CM

## 2018-06-02 DIAGNOSIS — E1165 Type 2 diabetes mellitus with hyperglycemia: Secondary | ICD-10-CM | POA: Diagnosis not present

## 2018-06-02 DIAGNOSIS — Z794 Long term (current) use of insulin: Secondary | ICD-10-CM | POA: Diagnosis not present

## 2018-06-02 DIAGNOSIS — R112 Nausea with vomiting, unspecified: Secondary | ICD-10-CM

## 2018-06-02 DIAGNOSIS — Z79899 Other long term (current) drug therapy: Secondary | ICD-10-CM | POA: Insufficient documentation

## 2018-06-02 DIAGNOSIS — R109 Unspecified abdominal pain: Secondary | ICD-10-CM | POA: Insufficient documentation

## 2018-06-02 DIAGNOSIS — R739 Hyperglycemia, unspecified: Secondary | ICD-10-CM

## 2018-06-02 LAB — URINALYSIS, ROUTINE W REFLEX MICROSCOPIC
Bacteria, UA: NONE SEEN
Bilirubin Urine: NEGATIVE
Glucose, UA: >=500 mg/dL — AB
Hgb urine dipstick: NEGATIVE
Ketones, ur: 80 mg/dL — AB
Leukocytes, UA: NEGATIVE
Nitrite: NEGATIVE
Protein, ur: NEGATIVE mg/dL
Specific Gravity, Urine: 1.037 — ABNORMAL HIGH (ref 1.005–1.030)
pH: 5 (ref 5.0–8.0)

## 2018-06-02 LAB — COMPREHENSIVE METABOLIC PANEL
ALK PHOS: 131 U/L — AB (ref 38–126)
ALT: 28 U/L (ref 0–44)
AST: 24 U/L (ref 15–41)
Albumin: 4.2 g/dL (ref 3.5–5.0)
Anion gap: 15 (ref 5–15)
BILIRUBIN TOTAL: 0.9 mg/dL (ref 0.3–1.2)
BUN: 15 mg/dL (ref 6–20)
CO2: 23 mmol/L (ref 22–32)
Calcium: 9.3 mg/dL (ref 8.9–10.3)
Chloride: 94 mmol/L — ABNORMAL LOW (ref 98–111)
Creatinine, Ser: 0.54 mg/dL (ref 0.44–1.00)
GFR calc Af Amer: >60 mL/min (ref >60)
Glucose, Bld: 377 mg/dL — ABNORMAL HIGH (ref 70–99)
Potassium: 3.9 mmol/L (ref 3.5–5.1)
Sodium: 132 mmol/L — ABNORMAL LOW (ref 135–145)
Total Protein: 7.8 g/dL (ref 6.5–8.1)

## 2018-06-02 LAB — CBC
HCT: 45.8 % (ref 36.0–46.0)
Hemoglobin: 15.2 g/dL — ABNORMAL HIGH (ref 12.0–15.0)
MCH: 30.4 pg (ref 26.0–34.0)
MCHC: 33.2 g/dL (ref 30.0–36.0)
MCV: 91.6 fL (ref 80.0–100.0)
Platelets: 203 10*3/uL (ref 150–400)
RBC: 5 MIL/uL (ref 3.87–5.11)
RDW: 13.4 % (ref 11.5–15.5)
WBC: 11.7 10*3/uL — AB (ref 4.0–10.5)
nRBC: 0 % (ref 0.0–0.2)

## 2018-06-02 LAB — PREGNANCY, URINE: Preg Test, Ur: NEGATIVE

## 2018-06-02 LAB — CBG MONITORING, ED
GLUCOSE-CAPILLARY: 365 mg/dL — AB (ref 70–99)
Glucose-Capillary: 305 mg/dL — ABNORMAL HIGH (ref 70–99)

## 2018-06-02 LAB — LIPASE, BLOOD: Lipase: 107 U/L — ABNORMAL HIGH (ref 11–51)

## 2018-06-02 MED ORDER — SODIUM CHLORIDE 0.9% FLUSH
3.0000 mL | Freq: Once | INTRAVENOUS | Status: AC
  Administered 2018-06-02: 3 mL via INTRAVENOUS

## 2018-06-02 MED ORDER — SODIUM CHLORIDE 0.9 % IV BOLUS
1000.0000 mL | Freq: Once | INTRAVENOUS | Status: AC
  Administered 2018-06-02: 1000 mL via INTRAVENOUS

## 2018-06-02 MED ORDER — HYDROMORPHONE HCL 1 MG/ML IJ SOLN
1.0000 mg | Freq: Once | INTRAMUSCULAR | Status: AC
  Administered 2018-06-02: 1 mg via INTRAVENOUS
  Filled 2018-06-02: qty 1

## 2018-06-02 MED ORDER — DIPHENHYDRAMINE HCL 50 MG/ML IJ SOLN
12.5000 mg | Freq: Once | INTRAMUSCULAR | Status: AC
  Administered 2018-06-02: 12.5 mg via INTRAVENOUS
  Filled 2018-06-02: qty 1

## 2018-06-02 MED ORDER — DICYCLOMINE HCL 10 MG PO CAPS
10.0000 mg | ORAL_CAPSULE | Freq: Once | ORAL | Status: AC
  Administered 2018-06-02: 10 mg via ORAL
  Filled 2018-06-02: qty 1

## 2018-06-02 MED ORDER — METOCLOPRAMIDE HCL 5 MG/ML IJ SOLN
10.0000 mg | Freq: Once | INTRAMUSCULAR | Status: AC
  Administered 2018-06-02: 10 mg via INTRAVENOUS
  Filled 2018-06-02: qty 2

## 2018-06-02 NOTE — Discharge Instructions (Addendum)
Stay hydrated and limit your sugar/carb intake. This will help with your high blood sugar. If you continue to have high blood sugars, discuss this with your primary care provider. Take your pain and nausea medications as prescribed for symptoms. Schedule an appointment with your GI specialist/pain management provider to follow-up on your visit today.

## 2018-06-02 NOTE — ED Triage Notes (Signed)
Pt reports abd pain and nausea x3 days. Pt reports taking morning insulin this morning around 7am and checked her blood sugar again around 10a and it was 415. Pt denies V/D, urinary symptoms, and fever.

## 2018-06-02 NOTE — ED Provider Notes (Signed)
[facility] DEPT Provider Note   CSN: [id number] Arrival date & time: 06/02/18  1105     History   Chief Complaint Chief Complaint  Patient presents with  . Abdominal Pain  . Hyperglycemia    HPI Heather Hunt is a 38 y.o. female with past medical history of insulin-dependent type 2 diabetes, chronic abdominal pain, chronic pancreatitis, presenting to the emergency department with complaint of worsening chronic abdominal pain x3 days.  Patient states she has intermittent sharp pains in her right upper quadrant and epigastric area of her abdomen.  It is comparable to her chronic abdominal pain, states this feels like a "flare up."  She is followed by Dr. Geoffry Paradise for pain management, takes oxycodone and uses fentanyl patches daily.  She states her change she changed her fentanyl patch this morning and her last dose of oxycodone was around 9 AM.  She has associated nausea without vomiting, polyuria.  Also treated nausea with Phenergan and Protonix at 9 AM.  She states her blood glucose was high this morning, around 415.  She treats with 26 units of insulin in the morning and then uses sliding scale.  She did take her insulin this morning.  States she feels dehydrated and has been urinating frequently.  No associated vomiting, diarrhea, constipation, dysuria, fevers, chills.  Patient is status post cholecystectomy.  Patient had celiac plexus injection for pain management in January.  The history is provided by the patient.    Past Medical History:  Diagnosis Date  . Diabetes mellitus without complication ([place])   . Pancreatitis   . Pancreatitis     Patient Active Problem List   Diagnosis Date Noted  . Transaminitis 01/25/2018  . Pancreatitis 01/25/2018  . Dehydration 01/25/2018  . Hyperglycemia 01/25/2018  . Acute on chronic pancreatitis ([place]) 01/15/2018  . IDDM (insulin dependent diabetes mellitus) ([place]) 01/15/2018    Past Surgical History:  Procedure  Laterality Date  . BILE DUCT STENT PLACEMENT    . BILIARY STENT PLACEMENT N/A 01/27/2018   Procedure: BILIARY STENT PLACEMENT;  Surgeon: Clarene Essex, MD;  Location: [facility] ENDOSCOPY;  Service: Endoscopy;  Laterality: N/A;  . CHOLECYSTECTOMY    . ERCP N/A 01/27/2018   Procedure: ENDOSCOPIC RETROGRADE CHOLANGIOPANCREATOGRAPHY (ERCP);  Surgeon: Clarene Essex, MD;  Location: [facility];  Service: Endoscopy;  Laterality: N/A;  . KNEE SURGERY    . pancreas stent    . STENT REMOVAL  01/27/2018   Procedure: STENT REMOVAL;  Surgeon: Clarene Essex, MD;  Location: [facility]OSCOPY;  Service: Endoscopy;;  . SYMPATHECTOMY       OB History   No obstetric history on file.      Home Medications    Prior to Admission medications   Medication Sig Start Date End Date Taking? Authorizing Provider  escitalopram (LEXAPRO) 10 MG tablet Take 10 mg by mouth daily.    Yes [provider]  feeding supplement, ENSURE ENLIVE, (ENSURE ENLIVE) LIQD Take 237 mLs by mouth 2 (two) times daily between meals. Patient taking differently: Take 237 mLs by mouth daily as needed (nutrition supplement).  01/29/18  Yes Mariel Aloe, MD  fentaNYL (DURAGESIC - DOSED MCG/HR) 12 MCG/HR Place 25 mcg onto the skin every 3 (three) days.    Yes [provider]  insulin degludec (TRESIBA FLEXTOUCH) 100 UNIT/ML SOPN FlexTouch Pen Inject 0.26 mLs (26 Units total) into the skin daily. 01/17/18  Yes Hall, Carole N, DO  insulin regular (HUMULIN R) 100 units/mL injection Inject 0.01-0.1  mLs (1-10 Units total) into the skin 3 (three) times daily before meals. Sliding scale Patient taking differently: Inject 1-10 Units into the skin 3 (three) times daily before meals. Per sliding scale 01/17/18  Yes Kayleen Memos, DO  lipase/protease/amylase (CREON) 12000 units CPEP capsule Take 2 capsules (24,000 Units total) by mouth 3 (three) times daily. 01/17/18  Yes Hall, Carole N, DO  ondansetron (ZOFRAN ODT) 4 MG disintegrating tablet Take 1  tablet (4 mg total) by mouth every 8 (eight) hours as needed for nausea. 02/08/18  Yes Larene Pickett, PA-C  Oxycodone HCl 10 MG TABS Take 10 mg by mouth 3 (three) times daily.   Yes [provider]  pantoprazole (PROTONIX) 40 MG tablet Take 40 mg by mouth daily. 10/12/17  Yes [provider]  potassium chloride (K-DUR) 10 MEQ tablet Take 1 tablet (10 mEq total) by mouth daily. 01/13/18  Yes Milton Ferguson, MD  pregabalin (LYRICA) 50 MG capsule Take 50 mg by mouth 3 (three) times daily.   Yes [provider]  promethazine (PHENERGAN) 12.5 MG tablet Take 12.5 mg by mouth every 6 (six) hours as needed for nausea or vomiting.  11/11/17  Yes [provider]  blood glucose meter kit and supplies KIT Dispense based on patient and insurance preference. Use up to four times daily as directed. (FOR ICD-9 250.00, 250.01). 12/07/17   Waynetta Pean, PA-C    Family History Family History  Problem Relation Age of Onset  . Cancer Father   . Lung cancer Father   . Diabetes Neg Hx   . Stroke Neg Hx   . CAD Neg Hx     Social History Social History   Tobacco Use  . Smoking status: Current Every Day Smoker    Packs/day: 0.50    Types: Cigarettes  . Smokeless tobacco: Never Used  Substance Use Topics  . Alcohol use: Not Currently  . Drug use: Never     Allergies   Morphine and related   Review of Systems Review of Systems  Constitutional: Negative for chills and fever.  Gastrointestinal: Positive for abdominal pain and nausea. Negative for constipation, diarrhea and vomiting.  Endocrine: Positive for polyuria.  Genitourinary: Negative for dysuria.  All other systems reviewed and are negative.    Physical Exam Updated Vital Signs BP 106/86 (BP Location: Right Arm)   Pulse 85   Temp 98.3 F (36.8 C) (Oral)   Resp 18   Ht _0  (1.676 m)   Wt 58.9 kg   SpO2 94%   BMI 20.96 kg/m   Physical Exam Vitals signs and nursing note reviewed.    Constitutional:      General: She is not in acute distress.    Appearance: She is well-developed. She is not toxic-appearing.     Comments: Thin appearing female  HENT:     Head: Normocephalic and atraumatic.     Mouth/Throat:     Mouth: Mucous membranes are moist.  Eyes:     Conjunctiva/sclera: Conjunctivae normal.  Cardiovascular:     Rate and Rhythm: Normal rate and regular rhythm.  Pulmonary:     Effort: Pulmonary effort is normal.     Breath sounds: Normal breath sounds.  Abdominal:     General: Abdomen is flat. A surgical scar is present. Bowel sounds are normal.     Palpations: Abdomen is soft.     Tenderness: There is generalized abdominal tenderness (Generalized tenderness, however states pain is worse with palpation in  the epigastrium and right upper quadrant.). There is no rebound.  Skin:    General: Skin is warm.  Neurological:     Mental Status: She is alert.  Psychiatric:        Behavior: Behavior normal.      ED Treatments / Results  Labs (all labs ordered are listed, but only abnormal results are displayed) Labs Reviewed  LIPASE, BLOOD - Abnormal; Notable for the following components:      Result Value   Lipase 107 (*)    All other components within normal limits  COMPREHENSIVE METABOLIC PANEL - Abnormal; Notable for the following components:   Sodium 132 (*)    Chloride 94 (*)    Glucose, Bld 377 (*)    Alkaline Phosphatase 131 (*)    All other components within normal limits  CBC - Abnormal; Notable for the following components:   WBC 11.7 (*)    Hemoglobin 15.2 (*)    All other components within normal limits  URINALYSIS, ROUTINE W REFLEX MICROSCOPIC - Abnormal; Notable for the following components:   Color, Urine AMBER (*)    APPearance CLOUDY (*)    Specific Gravity, Urine 1.037 (*)    Glucose, UA >=500 (*)    Ketones, ur 80 (*)    All other components within normal limits  CBG MONITORING, ED - Abnormal; Notable for the following  components:   Glucose-Capillary 365 (*)    All other components within normal limits  CBG MONITORING, ED - Abnormal; Notable for the following components:   Glucose-Capillary 305 (*)    All other components within normal limits  PREGNANCY, URINE    EKG None  Radiology No results found.  Procedures Procedures (including critical care time)  Medications Ordered in ED Medications  sodium chloride flush (NS) 0.9 % injection 3 mL (3 mLs Intravenous Given 06/02/18 1354)  sodium chloride 0.9 % bolus 1,000 mL (0 mLs Intravenous Stopped 06/02/18 1500)  metoCLOPramide (REGLAN) injection 10 mg (10 mg Intravenous Given 06/02/18 1353)  diphenhydrAMINE (BENADRYL) injection 12.5 mg (12.5 mg Intravenous Given 06/02/18 1353)  dicyclomine (BENTYL) capsule 10 mg (10 mg Oral Given 06/02/18 1352)  HYDROmorphone (DILAUDID) injection 1 mg (1 mg Intravenous Given 06/02/18 1505)  HYDROmorphone (DILAUDID) injection 1 mg (1 mg Intravenous Given 06/02/18 1723)     Initial Impression / Assessment and Plan / ED Course  I have reviewed the triage vital signs and the nursing notes.  Pertinent labs & imaging results that were available during my care of the patient were reviewed by me and considered in my medical decision making (see chart for details).  Clinical Course as of Jun 02 1905  Thu Jun 02, 2018  1336 No evidence of anion gap.  Hyperglycemic, mildly hyponatremic.  Will treat with IV fluids.  Comprehensive metabolic panel(!) [JR]  8416 Appears to be at patient's baseline.  Lipase, blood(!) [JR]  1337 No evidence of UTI.  Urinalysis, Routine w reflex microscopic(!) [JR]  1459 Patient reevaluated.  Reports improvement in nausea, is tolerating p.o.  However states pain is persistent and without improvement.  Will dose with dilaudid, finish IV fluids, with anticipated discharge.   [JR]    Clinical Course User Index [JR] Robinson, [place] N, PA-C    Patient with chronic abdominal pain/chronic pancreatitis and  insulin-dependent type 2 diabetes, followed by pain management, presenting with "flare up" of her chronic abdominal pain x3 days.  Patient appears to be hyperglycemic however no evidence of DKA.  Abdominal exam  is nonfocal.  Vital signs are stable.  Lipase appears to be at baseline.  Will treat with IV fluids, antiemetics and pain medication.  On reevaluation, patient tolerating p.o.  States pain is not improved at all.  Dosed with Dilaudid.  Patient requesting repeat CBG, down to 300.  Discussed with patient she is currently tolerating p.o. can continue to orally hydrate.  Will discharge with instruction to follow-up with PCP/GI specialist.  No evidence of acute pathology with reassuring labs, exam, and work-up today.  Agreeable to plan and safe for discharge.  Discussed results, findings, treatment and follow up. Patient advised of return precautions. Patient verbalized understanding and agreed with plan.  Final Clinical Impressions(s) / ED Diagnoses   Final diagnoses:  Chronic abdominal pain  Non-intractable vomiting with nausea, unspecified vomiting type  Hyperglycemia    ED Discharge Orders    None       Robinson, [place] N, PA-C 06/02/18 Raiford Noble, MD 06/05/18 1531

## 2018-06-21 ENCOUNTER — Other Ambulatory Visit: Payer: Self-pay | Admitting: Gastroenterology

## 2018-06-21 DIAGNOSIS — K8689 Other specified diseases of pancreas: Secondary | ICD-10-CM

## 2018-06-21 DIAGNOSIS — R932 Abnormal findings on diagnostic imaging of liver and biliary tract: Secondary | ICD-10-CM

## 2018-06-22 ENCOUNTER — Other Ambulatory Visit: Payer: Self-pay | Admitting: Gastroenterology

## 2018-06-24 ENCOUNTER — Inpatient Hospital Stay: Admission: RE | Admit: 2018-06-24 | Payer: BLUE CROSS/BLUE SHIELD | Source: Ambulatory Visit

## 2019-01-10 IMAGING — CT CT ABD-PELV W/ CM
2 of 4 series · 14 of 46 positions shown, 16 images · IV contrast (iopamidol)
Comparison: 01/13/2018

CLINICAL DATA: Abdominal pain, history of recurrent pancreatitis

EXAM:
CT ABDOMEN AND PELVIS WITH CONTRAST
TECHNIQUE: Multidetector CT imaging of the abdomen and pelvis was performed
using the standard protocol following bolus administration of
intravenous contrast.
CONTRAST:  100mL JMFWZW-GFF IOPAMIDOL (JMFWZW-GFF) INJECTION 61%

[Series 2: axial st · axial · 0.81mm/px · z∈[+415,+850]mm · 11 of 97 slices shown, 13 images]
[im 5/97  soft-tissue]
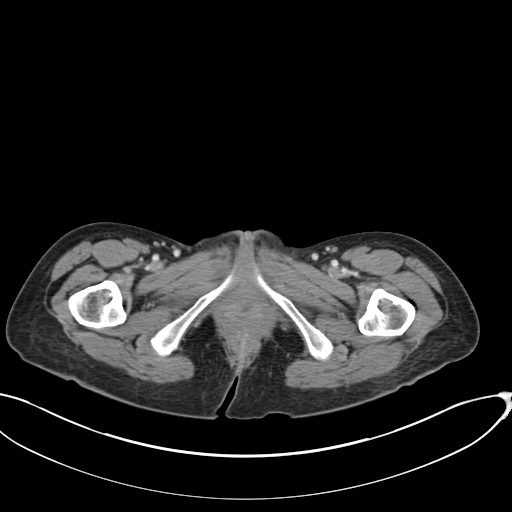
[im 5/97  bone]
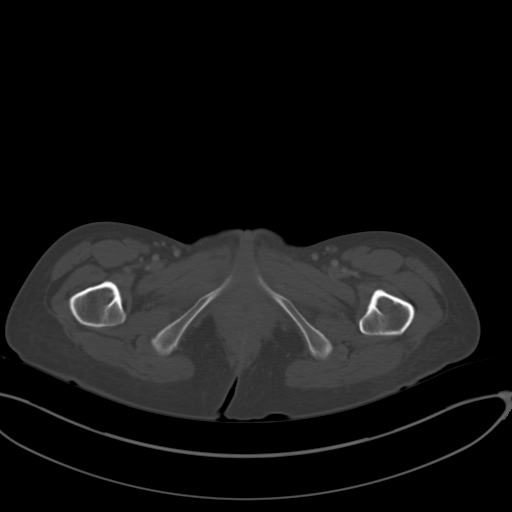
[im 15/97  soft-tissue]
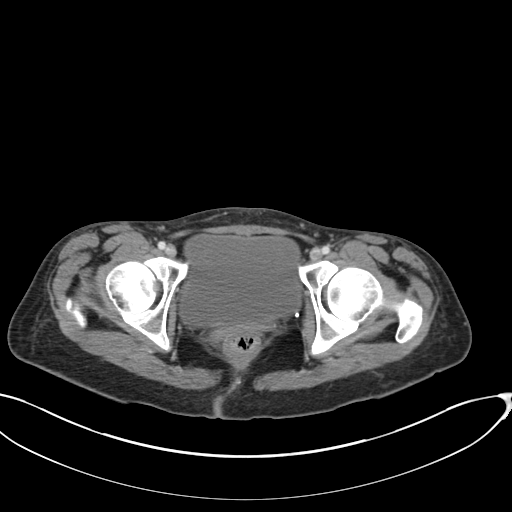
[im 25/97  soft-tissue]
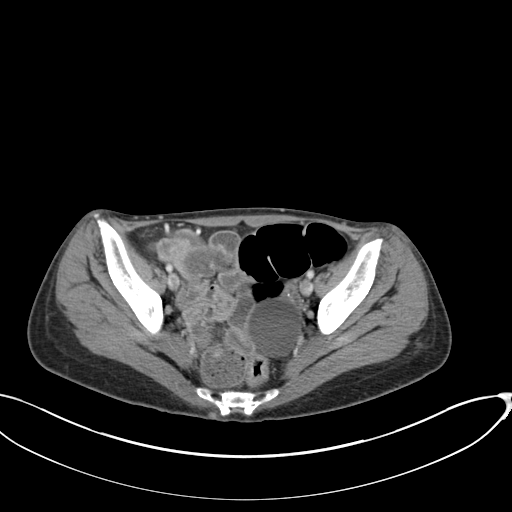
[im 34/97  soft-tissue]
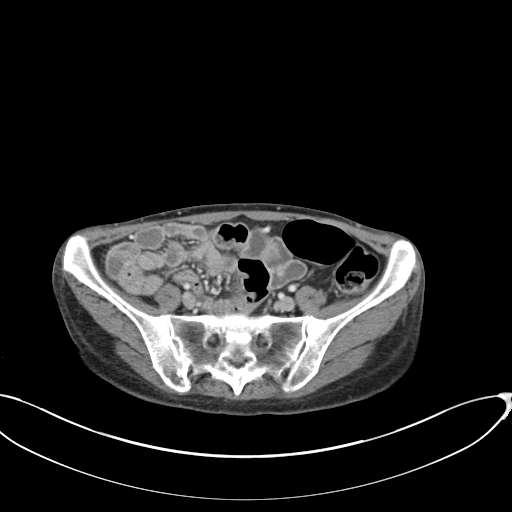
[im 39/97  soft-tissue]
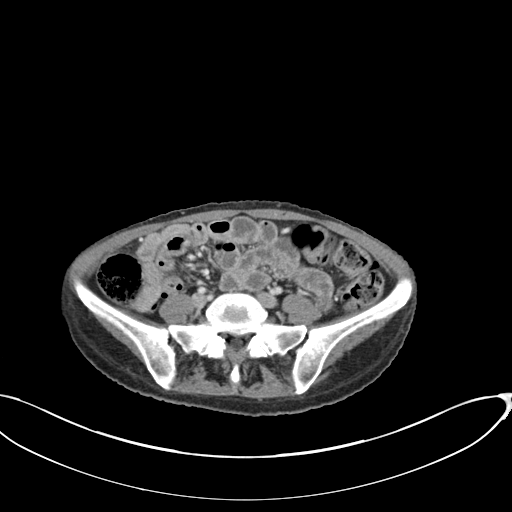
[im 49/97  soft-tissue]
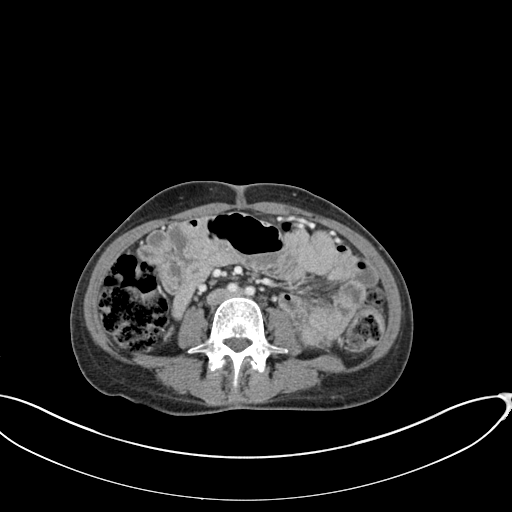
[im 58/97  soft-tissue]
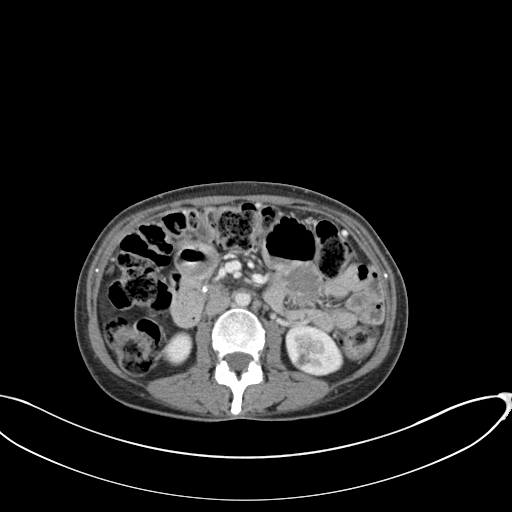
[im 63/97  soft-tissue]
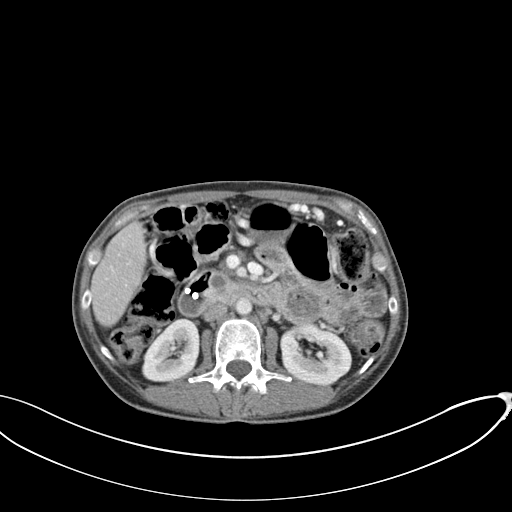
[im 73/97  soft-tissue]
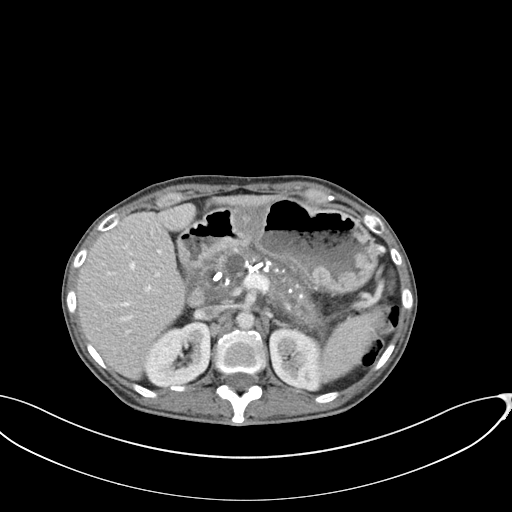
[im 73/97  bone]
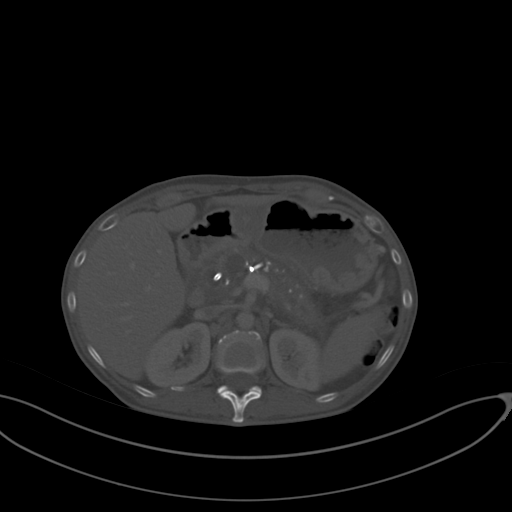
[im 82/97  soft-tissue]
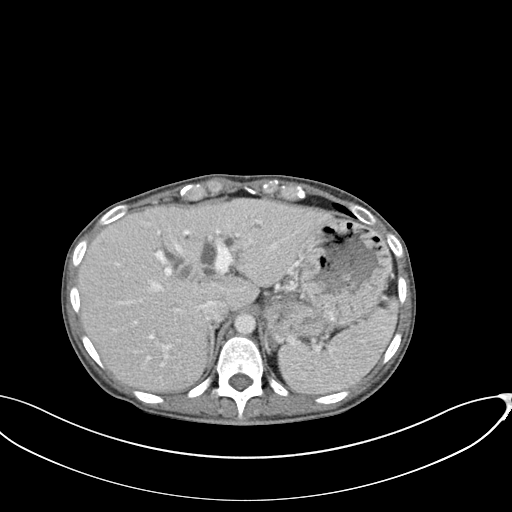
[im 92/97  soft-tissue]
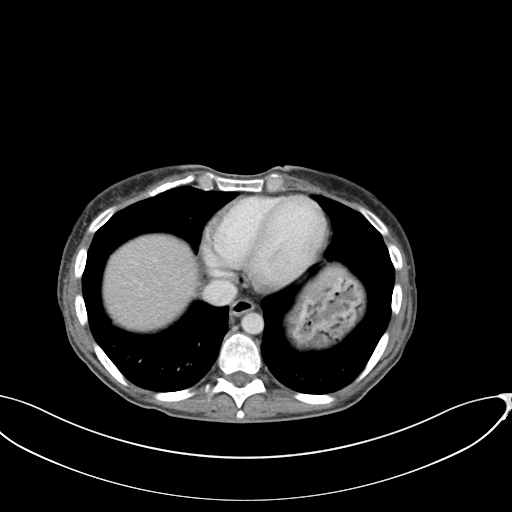

[Series 4: coronal st · coronal · 0.65mm/px · 3 of 84 slices shown]
[im 28/84  soft-tissue]
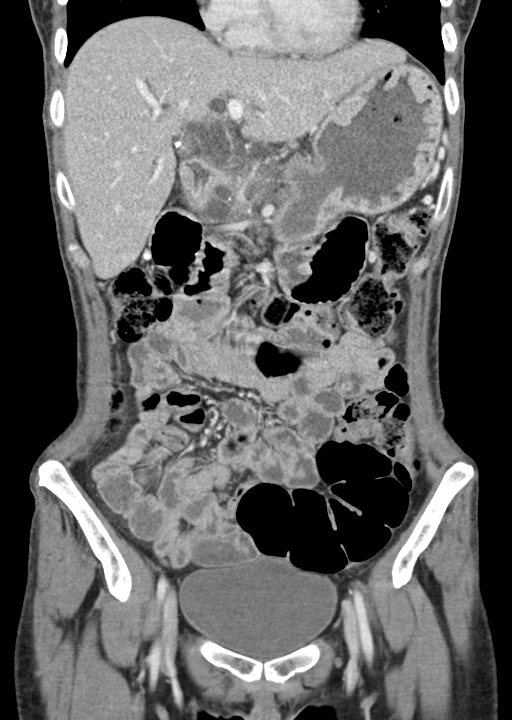
[im 37/84  soft-tissue]
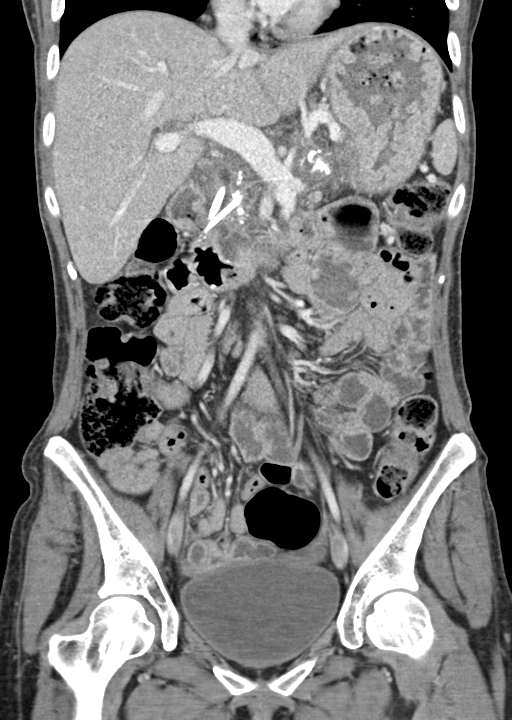
[im 47/84  soft-tissue]
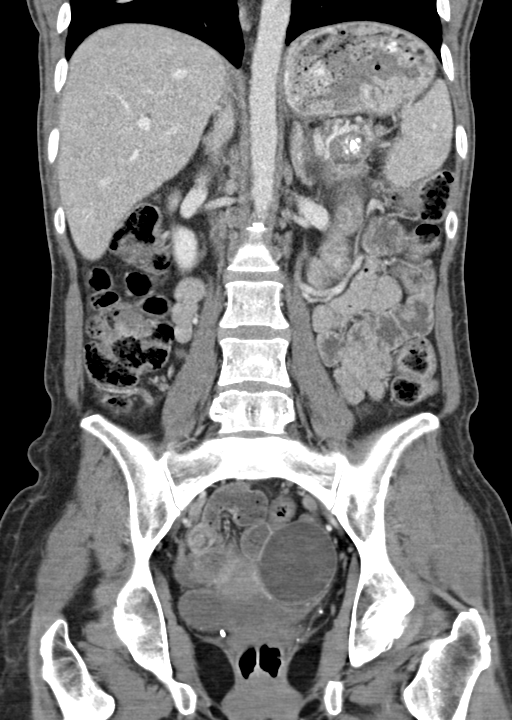

[14 of 46 positions shown; findings below may reference images not displayed]

FINDINGS: Lower chest: Minor dependent atelectasis/hyperventilation. No lower
lobe pneumonia. Normal heart size. No pericardial pleural effusion.

Hepatobiliary: Stable diffuse biliary dilatation. Remote
cholecystectomy noted. Endoscopic pancreatic and common bile duct
stents are stable in position.

No new focal hepatic abnormality.

Pancreas: Similar appearance of diffuse pancreatic edema with
decreased parenchymal enhancement. Diffuse coarse pancreatic
calcifications. Stable pancreatic ductal dilatation with a proximal
pancreatic stent extending into the duodenum. Similar peripancreatic
mild strandy edema/fluid. Multifocal pancreatic loculated fluid
collections as before, largest in the pancreatic head region is
cm, previously 2.4 cm. No new fluid collections. Overall stable
appearance of acute on chronic pancreatitis. No significant interval
change or new finding.

Spleen: Normal in size without focal abnormality.

Adrenals/Urinary Tract: Normal adrenal glands. No acute renal
obstruction, hydronephrosis, hydroureter, or obstructing ureteral
calculus. Urinary bladder unremarkable. Left kidney demonstrates a
lower pole posterior cortical fat attenuation lesion compatible with
an angiomyolipoma measuring 12 mm, image 41 series 2.

Stomach/Bowel: Negative for bowel obstruction, significant
dilatation, ileus, or free air. Normal appendix demonstrated.
Moderate colonic stool burden. No fluid collection or abscess.
Overall stable appearance.

Vascular/Lymphatic: Aortic atherosclerosis noted without occlusive
process or aneurysm. No retroperitoneal hemorrhage. Mesenteric and
renal vasculature remain patent. No adenopathy.

Stable enlargement of the portal vein and mesenteric veins without
portal vein occlusion or thrombus. There is tapered attenuation of
the splenic vein into the splenic hilum suspicious for chronic
peripheral splenic vein thrombosis with surrounding short gastric
collaterals/varices.

Reproductive: Uterus normal in size. Right ovary and adnexa
unremarkable. Left ovary contains a hypodense cyst measuring 5.1 cm.

Other: No abdominal wall hernia or abnormality. No abdominopelvic
ascites.

Musculoskeletal: No acute osseous finding
IMPRESSION: Stable acute on chronic pancreatitis with similar small loculated
pancreatic fluid collections, suspect chronic pseudocysts. Stable
pancreatic duct dilatation and diffuse biliary dilatation with
unchanged endoscopic common bile duct and pancreatic stents.

No new fluid collection or abscess.

Remote cholecystectomy

Suspect chronic distal splenic vein thrombosis with short gastric
collaterals/varices. Portal vein and mesenteric veins remain patent.

Atherosclerosis of the aorta without aneurysm

5.1 cm left ovarian cyst

12 mm left renal angiomyolipoma
